# Patient Record
Sex: Male | Born: 1960
Health system: Southern US, Community
[De-identification: ages and names within clinical notes are randomized; demographics above are authoritative.]

## PROBLEM LIST (undated history)

## (undated) DIAGNOSIS — D440 Neoplasm of uncertain behavior of thyroid gland: Secondary | ICD-10-CM

## (undated) DIAGNOSIS — C61 Malignant neoplasm of prostate: Secondary | ICD-10-CM

## (undated) DIAGNOSIS — C73 Malignant neoplasm of thyroid gland: Secondary | ICD-10-CM

## (undated) DIAGNOSIS — E039 Hypothyroidism, unspecified: Secondary | ICD-10-CM

## (undated) DIAGNOSIS — I2510 Atherosclerotic heart disease of native coronary artery without angina pectoris: Secondary | ICD-10-CM

## (undated) HISTORY — DX: Atherosclerotic heart disease of native coronary artery without angina pectoris: I25.10

## (undated) HISTORY — DX: Malignant neoplasm of thyroid gland: C73

## (undated) HISTORY — PX: PROSTATECTOMY: SHX69

## (undated) HISTORY — PX: COLONOSCOPY: SHX174

## (undated) HISTORY — DX: Neoplasm of uncertain behavior of thyroid gland: D44.0

## (undated) HISTORY — DX: Malignant neoplasm of prostate: C61

---

## 2005-10-21 DIAGNOSIS — C61 Malignant neoplasm of prostate: Secondary | ICD-10-CM

## 2005-10-21 HISTORY — DX: Malignant neoplasm of prostate: C61

## 2012-05-12 ENCOUNTER — Ambulatory Visit (HOSPITAL_COMMUNITY)
Admission: RE | Admit: 2012-05-12 | Discharge: 2012-05-12 | Disposition: A | Discharge status: Home / Self Care | Payer: Managed Care, Other (non HMO) | Source: Ambulatory Visit | Attending: Gastroenterology | Admitting: Gastroenterology

## 2012-05-12 ENCOUNTER — Other Ambulatory Visit (HOSPITAL_COMMUNITY): Payer: Self-pay | Admitting: Gastroenterology

## 2012-05-12 DIAGNOSIS — R933 Abnormal findings on diagnostic imaging of other parts of digestive tract: Secondary | ICD-10-CM

## 2014-04-04 ENCOUNTER — Encounter: Payer: Self-pay | Admitting: Family Medicine

## 2014-04-12 ENCOUNTER — Encounter: Payer: Self-pay | Admitting: Family Medicine

## 2015-07-18 ENCOUNTER — Other Ambulatory Visit: Payer: Self-pay | Admitting: Obstetrics and Gynecology

## 2015-07-18 DIAGNOSIS — E041 Nontoxic single thyroid nodule: Secondary | ICD-10-CM

## 2015-08-03 ENCOUNTER — Ambulatory Visit
Admission: RE | Admit: 2015-08-03 | Discharge: 2015-08-03 | Disposition: A | Discharge status: Home / Self Care | Payer: BLUE CROSS/BLUE SHIELD | Source: Ambulatory Visit | Attending: Obstetrics and Gynecology | Admitting: Obstetrics and Gynecology

## 2015-08-03 DIAGNOSIS — E041 Nontoxic single thyroid nodule: Secondary | ICD-10-CM

## 2015-09-20 ENCOUNTER — Other Ambulatory Visit: Payer: Self-pay | Admitting: Surgery

## 2015-09-20 DIAGNOSIS — E041 Nontoxic single thyroid nodule: Secondary | ICD-10-CM

## 2015-09-27 ENCOUNTER — Ambulatory Visit
Admission: RE | Admit: 2015-09-27 | Discharge: 2015-09-27 | Disposition: A | Discharge status: Home / Self Care | Payer: BLUE CROSS/BLUE SHIELD | Source: Ambulatory Visit | Attending: Surgery | Admitting: Surgery

## 2015-09-27 ENCOUNTER — Other Ambulatory Visit (HOSPITAL_COMMUNITY)
Admission: RE | Admit: 2015-09-27 | Discharge: 2015-09-27 | Disposition: A | Discharge status: Home / Self Care | Payer: BLUE CROSS/BLUE SHIELD | Source: Ambulatory Visit | Attending: Radiology | Admitting: Radiology

## 2015-09-27 DIAGNOSIS — E042 Nontoxic multinodular goiter: Secondary | ICD-10-CM | POA: Diagnosis present

## 2015-09-27 DIAGNOSIS — E041 Nontoxic single thyroid nodule: Secondary | ICD-10-CM

## 2015-10-22 DIAGNOSIS — C73 Malignant neoplasm of thyroid gland: Secondary | ICD-10-CM

## 2015-10-22 HISTORY — DX: Malignant neoplasm of thyroid gland: C73

## 2015-11-01 ENCOUNTER — Ambulatory Visit: Payer: Self-pay | Admitting: Surgery

## 2015-11-13 NOTE — Patient Instructions (Signed)
Atzin Onken  11/13/2015   Your procedure is scheduled on: 11/16/2015   Report to [facility] Main  Entrance take [place]  elevators to 3rd floor to  [place] at   [place] AM.  Call this number if you have problems the morning of surgery [phone]   Remember: ONLY 1 PERSON MAY GO WITH YOU TO SHORT STAY TO GET  READY MORNING OF [place].  Do not eat food or drink liquids :After Midnight.     Take these medicines the morning of surgery with A SIP OF WATER: none                                 You may not have any metal on your body including hair pins and              piercings  Do not wear jewelry, , lotions, powders or perfumes, deodorant        .              Men may shave face and neck.   Do not bring valuables to the hospital. [place].  Contacts, dentures or bridgework may not be worn into surgery.  Leave suitcase in the car. After surgery it may be brought to your room.         Special Instructions: coughing and deep breathing exercises, leg exercises               Please read over the following fact sheets you were given: _____________________________________________________________________             [facility] - Preparing for Surgery Before surgery, you can play an important role.  Because skin is not sterile, your skin needs to be as free of germs as possible.  You can reduce the number of germs on your skin by washing with CHG (chlorahexidine gluconate) soap before surgery.  CHG is an antiseptic cleaner which kills germs and bonds with the skin to continue killing germs even after washing. Please DO NOT use if you have an allergy to CHG or antibacterial soaps.  If your skin becomes reddened/irritated stop using the CHG and inform your nurse when you arrive at Short Stay. Do not shave (including legs and underarms) for at least 48 hours prior to the first CHG shower.  You may shave  your face/neck. Please follow these instructions carefully:  1.  Shower with CHG Soap the night before surgery and the  morning of Surgery.  2.  If you choose to wash your hair, wash your hair first as usual with your  normal  shampoo.  3.  After you shampoo, rinse your hair and body thoroughly to remove the  shampoo.                           4.  Use CHG as you would any other liquid soap.  You can apply chg directly  to the skin and wash                       Gently with a scrungie or clean washcloth.  5.  Apply the CHG Soap to your  body ONLY FROM THE NECK DOWN.   Do not use on face/ open                           Wound or open sores. Avoid contact with eyes, ears mouth and genitals (private parts).                       Wash face,  Genitals (private parts) with your normal soap.             6.  Wash thoroughly, paying special attention to the area where your surgery  will be performed.  7.  Thoroughly rinse your body with warm water from the neck down.  8.  DO NOT shower/wash with your normal soap after using and rinsing off  the CHG Soap.                9.  Pat yourself dry with a clean towel.            10.  Wear clean pajamas.            11.  Place clean sheets on your bed the night of your first shower and do not  sleep with pets. Day of Surgery : Do not apply any lotions/deodorants the morning of surgery.  Please wear clean clothes to the hospital/surgery center.  FAILURE TO FOLLOW THESE INSTRUCTIONS MAY RESULT IN THE CANCELLATION OF YOUR SURGERY PATIENT SIGNATURE_________________________________  NURSE SIGNATURE__________________________________  ________________________________________________________________________

## 2015-11-13 NOTE — Anesthesia Preprocedure Evaluation (Addendum)
Anesthesia Evaluation  Patient identified by MRN, date of birth, ID band Patient awake    Reviewed: Allergy & Precautions, NPO status , Patient's Chart, lab work & pertinent test results  Airway Mallampati: II       Dental  (+) Chipped, Dental Advisory Given,    Pulmonary neg pulmonary ROS,    breath sounds clear to auscultation       Cardiovascular negative cardio ROS   Rhythm:Regular     Neuro/Psych negative neurological ROS  negative psych ROS   GI/Hepatic negative GI ROS, Neg liver ROS,   Endo/Other  negative endocrine ROS  Renal/GU negative Renal ROS   Hx Prostate CA    Musculoskeletal negative musculoskeletal ROS (+)   Abdominal (+)  Abdomen: soft.    Peds negative pediatric ROS (+)  Hematology negative hematology ROS (+)   Anesthesia Other Findings   Reproductive/Obstetrics negative OB ROS                            Anesthesia Physical Anesthesia Plan  ASA: II  Anesthesia Plan: General   Post-op Pain Management:    Induction: Intravenous  Airway Management Planned: Oral ETT  Additional Equipment:   Intra-op Plan:   Post-operative Plan: Extubation in OR  Informed Consent: I have reviewed the patients History and Physical, chart, labs and discussed the procedure including the risks, benefits and alternatives for the proposed anesthesia with the patient or authorized representative who has indicated his/her understanding and acceptance.     Plan Discussed with:   Anesthesia Plan Comments: (Check labs)        Anesthesia Quick Evaluation

## 2015-11-14 ENCOUNTER — Encounter (HOSPITAL_COMMUNITY)
Admission: RE | Admit: 2015-11-14 | Discharge: 2015-11-14 | Disposition: A | Discharge status: Home / Self Care | Payer: 59 | Source: Ambulatory Visit | Attending: Surgery | Admitting: Surgery

## 2015-11-14 ENCOUNTER — Ambulatory Visit (HOSPITAL_COMMUNITY)
Admission: RE | Admit: 2015-11-14 | Discharge: 2015-11-14 | Disposition: A | Discharge status: Home / Self Care | Payer: 59 | Source: Ambulatory Visit | Attending: Anesthesiology | Admitting: Anesthesiology

## 2015-11-14 ENCOUNTER — Encounter (HOSPITAL_COMMUNITY): Payer: Self-pay

## 2015-11-14 ENCOUNTER — Encounter (HOSPITAL_COMMUNITY): Payer: Self-pay | Admitting: Surgery

## 2015-11-14 DIAGNOSIS — D440 Neoplasm of uncertain behavior of thyroid gland: Secondary | ICD-10-CM

## 2015-11-14 DIAGNOSIS — Z01818 Encounter for other preprocedural examination: Secondary | ICD-10-CM | POA: Insufficient documentation

## 2015-11-14 HISTORY — DX: Neoplasm of uncertain behavior of thyroid gland: D44.0

## 2015-11-14 LAB — CBC
HCT: 39.8 % (ref 39.0–52.0)
Hemoglobin: 14.1 g/dL (ref 13.0–17.0)
MCH: 30.9 pg (ref 26.0–34.0)
MCHC: 35.4 g/dL (ref 30.0–36.0)
MCV: 87.3 fL (ref 78.0–100.0)
Platelets: 267 10*3/uL (ref 150–400)
RBC: 4.56 MIL/uL (ref 4.22–5.81)
RDW: 12.5 % (ref 11.5–15.5)
WBC: 4.1 10*3/uL (ref 4.0–10.5)

## 2015-11-14 NOTE — H&P (Signed)
  General Surgery [facility]  Todd Wood DOB: 1960-11-03 Married / Language: English / Race: White Male  History of Present Illness  Patient returns at my request to discuss his fine-needle aspiration thyroid biopsy results. Patient had bilateral thyroid nodules which required sampling. The dominant nodule on the right measured 2 cm and fine-needle aspiration biopsy showed benign thyroid nodule, Bethesda category II. On the left side, there was a 3.3 cm solid nodule with calcifications. Fine-needle aspiration biopsy showed suspicion of papillary thyroid carcinoma, Bethesda category V. Patient returns today to discuss these results and to make plans for definitive management.  Allergies No Known Drug Allergies11/29/2016  Medication History No Current Medications Medications Reconciled  Vitals Weight: 218 lb Height: 71in Body Surface Area: 2.19 m Body Mass Index: 30.4 kg/m  Pulse: 78 (Regular)  BP: 126/72 (Sitting, Left Arm, Standard)  Physical Exam  General - appears comfortable, no distress; not diaphorectic  HEENT - normocephalic; sclerae clear, gaze conjugate; mucous membranes moist, dentition good; voice normal  Neck - symmetric on extension; no palpable anterior or posterior cervical adenopathy; smooth firm palpable nodule right thyroid lobe approximately 2 cm in size, mobile with swallowing; smooth firm nodule approximately 3 cm in size inferior left thyroid lobe, palpable with swallowing  Ext - non-tender without significant edema or lymphedema  Neuro - grossly intact; no tremor   Assessment & Plan  PAPILLARY THYROID CARCINOMA (C73)  Patient returns to discuss biopsy results. We reviewed his cytopathology report as well as his original ultrasound report.  I have recommended proceeding with total thyroidectomy with limited lymph node dissection. We discussed procedure. We discussed risk and benefits including the potential for  recurrent laryngeal nerve injury and injury to parathyroid glands. We discussed the hospital stay to be anticipated in the postoperative recovery and return to work. We discussed the potential need for radioactive iodine treatment. Patient understands and wishes to proceed with surgery in the near future.  We will make arrangements for consultation with endocrinology following his surgical procedure. Recommendations for radioactive iodine treatment will be based on the final pathology results.  The risks and benefits of the procedure have been discussed at length with the patient. The patient understands the proposed procedure, potential alternative treatments, and the course of recovery to be expected. All of the patient's questions have been answered at this time. The patient wishes to proceed with surgery.  Earnstine Regal, MD, [facility] Office: [phone]

## 2015-11-16 ENCOUNTER — Ambulatory Visit (HOSPITAL_COMMUNITY): Payer: 59 | Admitting: Anesthesiology

## 2015-11-16 ENCOUNTER — Encounter (HOSPITAL_COMMUNITY): Payer: Self-pay | Admitting: *Deleted

## 2015-11-16 ENCOUNTER — Encounter (HOSPITAL_COMMUNITY)
Admission: RE | Disposition: A | Discharge status: Home / Self Care | Payer: Self-pay | Source: Ambulatory Visit | Attending: Surgery

## 2015-11-16 ENCOUNTER — Observation Stay (HOSPITAL_COMMUNITY)
Admission: RE | Admit: 2015-11-16 | Discharge: 2015-11-17 | Disposition: A | Discharge status: Home / Self Care | Payer: 59 | Source: Ambulatory Visit | Attending: Surgery | Admitting: Surgery

## 2015-11-16 DIAGNOSIS — C73 Malignant neoplasm of thyroid gland: Principal | ICD-10-CM | POA: Insufficient documentation

## 2015-11-16 DIAGNOSIS — D440 Neoplasm of uncertain behavior of thyroid gland: Secondary | ICD-10-CM | POA: Diagnosis present

## 2015-11-16 DIAGNOSIS — Z8546 Personal history of malignant neoplasm of prostate: Secondary | ICD-10-CM | POA: Diagnosis not present

## 2015-11-16 DIAGNOSIS — C770 Secondary and unspecified malignant neoplasm of lymph nodes of head, face and neck: Secondary | ICD-10-CM | POA: Insufficient documentation

## 2015-11-16 DIAGNOSIS — E042 Nontoxic multinodular goiter: Secondary | ICD-10-CM | POA: Diagnosis present

## 2015-11-16 HISTORY — PX: THYROIDECTOMY: SHX17

## 2015-11-16 LAB — BASIC METABOLIC PANEL
Anion gap: 9 (ref 5–15)
BUN: 16 mg/dL (ref 6–20)
CO2: 27 mmol/L (ref 22–32)
Calcium: 9 mg/dL (ref 8.9–10.3)
Chloride: 105 mmol/L (ref 101–111)
Creatinine, Ser: 0.89 mg/dL (ref 0.61–1.24)
GFR calc Af Amer: >60 mL/min (ref >60)
GFR calc non Af Amer: >60 mL/min (ref >60)
Glucose, Bld: 144 mg/dL — ABNORMAL HIGH (ref 65–99)
Potassium: 4.5 mmol/L (ref 3.5–5.1)
Sodium: 141 mmol/L (ref 135–145)

## 2015-11-16 SURGERY — THYROIDECTOMY
Anesthesia: General | Site: Neck

## 2015-11-16 MED ORDER — DEXAMETHASONE SODIUM PHOSPHATE 10 MG/ML IJ SOLN
INTRAMUSCULAR | Status: DC | PRN
  Administered 2015-11-16: 5 mg via INTRAVENOUS

## 2015-11-16 MED ORDER — LIDOCAINE HCL (CARDIAC) 20 MG/ML IV SOLN
INTRAVENOUS | Status: AC
  Filled 2015-11-16: qty 5

## 2015-11-16 MED ORDER — FENTANYL CITRATE (PF) 100 MCG/2ML IJ SOLN
INTRAMUSCULAR | Status: AC
  Filled 2015-11-16: qty 2

## 2015-11-16 MED ORDER — HYDROMORPHONE HCL 1 MG/ML IJ SOLN
0.2500 mg | INTRAMUSCULAR | Status: DC | PRN
  Administered 2015-11-16 (×2): 0.5 mg via INTRAVENOUS

## 2015-11-16 MED ORDER — LIDOCAINE HCL (CARDIAC) 20 MG/ML IV SOLN
INTRAVENOUS | Status: DC | PRN
  Administered 2015-11-16: 100 mg via INTRAVENOUS

## 2015-11-16 MED ORDER — FENTANYL CITRATE (PF) 100 MCG/2ML IJ SOLN
INTRAMUSCULAR | Status: DC | PRN
  Administered 2015-11-16: 50 ug via INTRAVENOUS
  Administered 2015-11-16 (×4): 25 ug via INTRAVENOUS
  Administered 2015-11-16: 50 ug via INTRAVENOUS

## 2015-11-16 MED ORDER — CALCIUM CARBONATE 1250 (500 CA) MG PO TABS
2.0000 | ORAL_TABLET | Freq: Three times a day (TID) | ORAL | Status: DC
Start: 2015-11-16 — End: 2015-11-17
  Administered 2015-11-17: 1000 mg via ORAL
  Filled 2015-11-16 (×5): qty 2

## 2015-11-16 MED ORDER — ACETAMINOPHEN 650 MG RE SUPP: 650.0000 mg | Freq: Four times a day (QID) | RECTAL | Status: DC | PRN

## 2015-11-16 MED ORDER — CEFAZOLIN SODIUM-DEXTROSE 2-3 GM-% IV SOLR
2.0000 g | INTRAVENOUS | Status: AC
  Administered 2015-11-16: 2 g via INTRAVENOUS

## 2015-11-16 MED ORDER — HYDROMORPHONE HCL 1 MG/ML IJ SOLN
INTRAMUSCULAR | Status: AC
  Filled 2015-11-16: qty 1

## 2015-11-16 MED ORDER — HYDROCODONE-ACETAMINOPHEN 5-325 MG PO TABS: 1.0000 | ORAL_TABLET | ORAL | Status: DC | PRN

## 2015-11-16 MED ORDER — ONDANSETRON HCL 4 MG/2ML IJ SOLN
INTRAMUSCULAR | Status: DC | PRN
  Administered 2015-11-16 (×2): 4 mg via INTRAVENOUS

## 2015-11-16 MED ORDER — SODIUM CHLORIDE 0.9 % IJ SOLN
INTRAMUSCULAR | Status: AC
  Filled 2015-11-16: qty 10

## 2015-11-16 MED ORDER — EPHEDRINE SULFATE 50 MG/ML IJ SOLN
INTRAMUSCULAR | Status: AC
  Filled 2015-11-16: qty 1

## 2015-11-16 MED ORDER — DEXAMETHASONE SODIUM PHOSPHATE 10 MG/ML IJ SOLN
INTRAMUSCULAR | Status: AC
  Filled 2015-11-16: qty 1

## 2015-11-16 MED ORDER — MIDAZOLAM HCL 5 MG/5ML IJ SOLN
INTRAMUSCULAR | Status: DC | PRN
Start: 2015-11-16 — End: 2015-11-16
  Administered 2015-11-16: 2 mg via INTRAVENOUS

## 2015-11-16 MED ORDER — ONDANSETRON 4 MG PO TBDP: 4.0000 mg | ORAL_TABLET | Freq: Four times a day (QID) | ORAL | Status: DC | PRN

## 2015-11-16 MED ORDER — 0.9 % SODIUM CHLORIDE (POUR BTL) OPTIME
TOPICAL | Status: DC | PRN
  Administered 2015-11-16: 1000 mL

## 2015-11-16 MED ORDER — ONDANSETRON HCL 4 MG/2ML IJ SOLN
INTRAMUSCULAR | Status: AC
  Filled 2015-11-16: qty 4

## 2015-11-16 MED ORDER — SUGAMMADEX SODIUM 200 MG/2ML IV SOLN
INTRAVENOUS | Status: AC
  Filled 2015-11-16: qty 2

## 2015-11-16 MED ORDER — PROPOFOL 10 MG/ML IV BOLUS
INTRAVENOUS | Status: DC | PRN
  Administered 2015-11-16: 200 mg via INTRAVENOUS

## 2015-11-16 MED ORDER — CEFAZOLIN SODIUM-DEXTROSE 2-3 GM-% IV SOLR
INTRAVENOUS | Status: AC
  Filled 2015-11-16: qty 50

## 2015-11-16 MED ORDER — HYDRALAZINE HCL 20 MG/ML IJ SOLN
10.0000 mg | Freq: Once | INTRAMUSCULAR | Status: AC | PRN
  Administered 2015-11-16: 10 mg via INTRAVENOUS

## 2015-11-16 MED ORDER — SUGAMMADEX SODIUM 200 MG/2ML IV SOLN
INTRAVENOUS | Status: DC | PRN
  Administered 2015-11-16: 200 mg via INTRAVENOUS

## 2015-11-16 MED ORDER — PROMETHAZINE HCL 25 MG/ML IJ SOLN: 6.2500 mg | INTRAMUSCULAR | Status: DC | PRN

## 2015-11-16 MED ORDER — PROPOFOL 10 MG/ML IV BOLUS
INTRAVENOUS | Status: AC
  Filled 2015-11-16: qty 20

## 2015-11-16 MED ORDER — FENTANYL CITRATE (PF) 100 MCG/2ML IJ SOLN
25.0000 ug | INTRAMUSCULAR | Status: DC | PRN
  Administered 2015-11-16 (×2): 50 ug via INTRAVENOUS

## 2015-11-16 MED ORDER — KCL IN DEXTROSE-NACL 20-5-0.45 MEQ/L-%-% IV SOLN
INTRAVENOUS | Status: DC
  Administered 2015-11-16: 20:00:00 via INTRAVENOUS
  Filled 2015-11-16 (×2): qty 1000

## 2015-11-16 MED ORDER — GLYCOPYRROLATE 0.2 MG/ML IJ SOLN
INTRAMUSCULAR | Status: AC
  Filled 2015-11-16: qty 1

## 2015-11-16 MED ORDER — GLYCOPYRROLATE 0.2 MG/ML IJ SOLN
INTRAMUSCULAR | Status: DC | PRN
  Administered 2015-11-16: 0.2 mg via INTRAVENOUS

## 2015-11-16 MED ORDER — ROCURONIUM BROMIDE 100 MG/10ML IV SOLN
INTRAVENOUS | Status: DC | PRN
  Administered 2015-11-16 (×4): 10 mg via INTRAVENOUS
  Administered 2015-11-16: 50 mg via INTRAVENOUS
  Administered 2015-11-16: 10 mg via INTRAVENOUS

## 2015-11-16 MED ORDER — HYDROMORPHONE HCL 1 MG/ML IJ SOLN: 1.0000 mg | INTRAMUSCULAR | Status: DC | PRN

## 2015-11-16 MED ORDER — ACETAMINOPHEN 325 MG PO TABS
650.0000 mg | ORAL_TABLET | Freq: Four times a day (QID) | ORAL | Status: DC | PRN
Start: 2015-11-16 — End: 2015-11-17

## 2015-11-16 MED ORDER — ONDANSETRON HCL 4 MG/2ML IJ SOLN
4.0000 mg | Freq: Four times a day (QID) | INTRAMUSCULAR | Status: DC | PRN
  Administered 2015-11-16: 4 mg via INTRAVENOUS
  Filled 2015-11-16: qty 2

## 2015-11-16 MED ORDER — MEPERIDINE HCL 50 MG/ML IJ SOLN: 6.2500 mg | INTRAMUSCULAR | Status: DC | PRN

## 2015-11-16 MED ORDER — EPHEDRINE SULFATE 50 MG/ML IJ SOLN
INTRAMUSCULAR | Status: DC | PRN
  Administered 2015-11-16: 5 mg via INTRAVENOUS

## 2015-11-16 MED ORDER — HYDRALAZINE HCL 20 MG/ML IJ SOLN
INTRAMUSCULAR | Status: AC
  Filled 2015-11-16: qty 1

## 2015-11-16 MED ORDER — MIDAZOLAM HCL 2 MG/2ML IJ SOLN
INTRAMUSCULAR | Status: AC
  Filled 2015-11-16: qty 2

## 2015-11-16 MED ORDER — LACTATED RINGERS IV SOLN
INTRAVENOUS | Status: DC | PRN
  Administered 2015-11-16 (×2): via INTRAVENOUS

## 2015-11-16 SURGICAL SUPPLY — 37 items
ATTRACTOMAT 16X20 MAGNETIC DRP (DRAPES) ×2 IMPLANT
BENZOIN TINCTURE PRP APPL 2/3 (GAUZE/BANDAGES/DRESSINGS) ×2 IMPLANT
BLADE HEX COATED 2.75 (ELECTRODE) ×2 IMPLANT
BLADE SURG 15 STRL LF DISP TIS (BLADE) ×1 IMPLANT
BLADE SURG 15 STRL SS (BLADE) ×1
CHLORAPREP W/TINT 26ML (MISCELLANEOUS) ×2 IMPLANT
CLIP TI MEDIUM 6 (CLIP) ×12 IMPLANT
CLIP TI WIDE RED SMALL 6 (CLIP) ×10 IMPLANT
CLOSURE STERI-STRIP 1/4X4 (GAUZE/BANDAGES/DRESSINGS) ×2 IMPLANT
COVER SURGICAL LIGHT HANDLE (MISCELLANEOUS) ×2 IMPLANT
DRAPE LAPAROTOMY T 98X78 PEDS (DRAPES) ×2 IMPLANT
DRESSING SURGICEL FIBRLLR 1X2 (HEMOSTASIS) ×1 IMPLANT
DRSG SURGICEL FIBRILLAR 1X2 (HEMOSTASIS) ×2
ELECT PENCIL ROCKER SW 15FT (MISCELLANEOUS) ×2 IMPLANT
ELECT REM PT RETURN 9FT ADLT (ELECTROSURGICAL) ×2
ELECTRODE REM PT RTRN 9FT ADLT (ELECTROSURGICAL) ×1 IMPLANT
GAUZE SPONGE 4X4 16PLY XRAY LF (GAUZE/BANDAGES/DRESSINGS) ×4 IMPLANT
GLOVE BIO SURGEON STRL SZ 6.5 (GLOVE) ×4 IMPLANT
GLOVE INDICATOR 6.5 STRL GRN (GLOVE) ×4 IMPLANT
GLOVE SURG ORTHO 8.0 STRL STRW (GLOVE) ×2 IMPLANT
GOWN STRL REUS W/TWL LRG LVL3 (GOWN DISPOSABLE) ×2 IMPLANT
GOWN STRL REUS W/TWL XL LVL3 (GOWN DISPOSABLE) ×6 IMPLANT
KIT BASIN OR (CUSTOM PROCEDURE TRAY) ×2 IMPLANT
PACK BASIC VI WITH GOWN DISP (CUSTOM PROCEDURE TRAY) ×2 IMPLANT
SHEARS HARMONIC 9CM CVD (BLADE) ×2 IMPLANT
STRIP CLOSURE SKIN 1/2X4 (GAUZE/BANDAGES/DRESSINGS) ×2 IMPLANT
SUT MNCRL AB 4-0 PS2 18 (SUTURE) ×2 IMPLANT
SUT SILK 2 0 (SUTURE)
SUT SILK 2-0 18XBRD TIE 12 (SUTURE) IMPLANT
SUT SILK 3 0 (SUTURE)
SUT SILK 3-0 18XBRD TIE 12 (SUTURE) IMPLANT
SUT VIC AB 3-0 SH 18 (SUTURE) ×4 IMPLANT
SYR BULB IRRIGATION 50ML (SYRINGE) ×2 IMPLANT
TAPE CLOTH SURG 4X10 WHT LF (GAUZE/BANDAGES/DRESSINGS) ×2 IMPLANT
TOWEL OR 17X26 10 PK STRL BLUE (TOWEL DISPOSABLE) ×2 IMPLANT
TOWEL OR NON WOVEN STRL DISP B (DISPOSABLE) ×2 IMPLANT
YANKAUER SUCT BULB TIP 10FT TU (MISCELLANEOUS) ×2 IMPLANT

## 2015-11-16 NOTE — Anesthesia Procedure Notes (Signed)
Procedure Name: Intubation Date/Time: 11/16/2015 8:08 AM Performed by: Freddie Breech Pre-anesthesia Checklist: Patient identified, Emergency Drugs available, Suction available, Patient being monitored and Timeout performed Patient Re-evaluated:Patient Re-evaluated prior to inductionOxygen Delivery Method: Circle system utilized Preoxygenation: Pre-oxygenation with 100% oxygen Intubation Type: IV induction Ventilation: Oral airway inserted - appropriate to patient size and Mask ventilation without difficulty Laryngoscope Size: Mac and 4 Grade View: Grade I Tube type: Oral Tube size: 7.0 mm Number of attempts: 1 Airway Equipment and Method: Patient positioned with wedge pillow and Stylet Placement Confirmation: ETT inserted through vocal cords under direct vision,  positive ETCO2,  CO2 detector and breath sounds checked- equal and bilateral Secured at: 21 cm Tube secured with: Tape Dental Injury: Teeth and Oropharynx as per pre-operative assessment

## 2015-11-16 NOTE — Anesthesia Postprocedure Evaluation (Signed)
Anesthesia Post Note  Patient: Todd Wood  Procedure(s) Performed: Procedure(s) (LRB): TOTAL THYROIDECTOMY WITH CENTRAL COMPARTMENT LYMPH NODE DISSECTION (N/A)  Patient location during evaluation: PACU Anesthesia Type: General Level of consciousness: awake and alert Pain management: pain level controlled Vital Signs Assessment: post-procedure vital signs reviewed and stable Respiratory status: spontaneous breathing, nonlabored ventilation, respiratory function stable and patient connected to nasal cannula oxygen Cardiovascular status: blood pressure returned to baseline and stable Postop Assessment: no signs of nausea or vomiting Anesthetic complications: no    Last Vitals:  Filed Vitals:   11/16/15 0549 11/16/15 1025  BP: 120/86 166/93  Pulse: 66 66  Temp: 36.6 C 36.5 C  Resp: 18 15    Last Pain:  Filed Vitals:   11/16/15 1032  PainSc: 4                  Todd Wood

## 2015-11-16 NOTE — Interval H&P Note (Signed)
History and Physical Interval Note:  11/16/2015 7:23 AM  Todd Wood  has presented today for surgery, with the diagnosis of Papillary thyroid carcinoma.  The various methods of treatment have been discussed with the patient and family. After consideration of risks, benefits and other options for treatment, the patient has consented to    Procedure(s): TOTAL THYROIDECTOMY LIMITED LYMPH NODE DISSECTION (N/A) as a surgical intervention .    The patient's history has been reviewed, patient examined, no change in status, stable for surgery.  I have reviewed the patient's chart and labs.  Questions were answered to the patient's satisfaction.    Earnstine Regal, MD, [facility] Office: [place]

## 2015-11-16 NOTE — Op Note (Signed)
NAMESANAT, [place] NO.:  [id number]  MEDICAL RECORD NO.:  [id number]  LOCATION:  [facility]                         FACILITY:  [facility]  PHYSICIAN:  Earnstine Regal, MD      DATE OF BIRTH:  [birth year]  DATE OF PROCEDURE:  11/16/2015                              OPERATIVE REPORT   PREOPERATIVE DIAGNOSIS:  Probable papillary thyroid carcinoma.  POSTOPERATIVE DIAGNOSIS:  Probable papillary thyroid carcinoma with regional lymph node metastasis, local invasion.  PROCEDURE:  Total thyroidectomy with limited central compartment lymph node dissection.  SURGEON:  Earnstine Regal, MD, FACS  ANESTHESIA:  General per Dr. Alexis Frock  ESTIMATED BLOOD LOSS:  50 mL.  PREPARATION:  ChloraPrep.  COMPLICATIONS:  None.  INDICATIONS:  The patient is a 55 year old white male referred with a thyroid mass.  Ultrasound-guided fine needle aspiration biopsy of a dominant 3.3 cm solid lesion in the left thyroid lobe with calcifications showed suspicion of papillary thyroid carcinoma Bethesda category V.  The patient now comes to surgery for resection.  BODY OF REPORT:  Procedure was done in OR #2 at the [facility].  The patient was brought to the operating room, placed in the supine position on the operating room table.  Following administration of general anesthesia, the patient was positioned and then prepped and draped in the usual aseptic fashion.  After ascertaining that an adequate level of anesthesia had been achieved, a Kocher incision was made with a #15 blade.  Dissection was carried through subcutaneous tissues and platysma.  Hemostasis was achieved with electrocautery.  Skin flaps were elevated cephalad and caudad from the thyroid notch to the sternal notch.  The Mahorner self-retaining retractor was placed for exposure.  Strap muscles were incised in the midline.  Dissection was begun on the left.  With some difficulty, the strap muscles were  mobilized off the underlying thyroid gland and mobilized laterally.  The thyroid is grossly abnormal, quite hard, multilobular, and somewhat adherent to the overlying strap muscles worrisome for local invasion.  The strap muscles were mobilized and the gland was gently dissected out.  Venous tributaries were divided between Ligaclips with the Harmonic scalpel.  Superior pole vessels were dissected out and divided individually between small and medium Ligaclips with the Harmonic scalpel.  Inferior pole was dissected out. There are obvious lymph node metastasis in the central compartment just inferior to the inferior pole of the thyroid.  These will be dissected out separately.  The gland is mobilized and rolled anteriorly.  Branches of the inferior thyroid artery were divided between small and medium Ligaclips with the Harmonic scalpel.  There was tumor extension posteriorly into the region of the esophagus and recurrent laryngeal nerve.  Care was taken to preserve structures, but it was difficult to identify either the nerve or the parathyroid glands.  Dissection was continued on the capsule of the gland and tumor.  Tumor appears to invade locally into the underlying airway.  It was gently mobilized using the electrocautery where necessary to separate the tumor from the underlying structures.  Ligament of Gwenlyn Found is released.  There appears that tumor extends into the tubercle of Zuckerkandl.  The gland was then resected off the trachea using the electrocautery.  There was no significant pyramidal lobe present.  Dry gauze dressing was placed in the left neck.  Next, we turned our attention to the right thyroid lobe.  The right thyroid lobe was somewhat softer, but indeed multinodular and appears to contain some cystic neoplasms.  It was gently mobilized with blunt dissection and venous tributaries divided between Ligaclips with the Harmonic scalpel.  Superior pole vessels were dissected  out and divided between medium Ligaclips with the Harmonic scalpel.  Gland was rolled anteriorly.  Branches of the inferior thyroid artery were divided between small Ligaclips.  Inferior pole was mobilized.  There appears to be tumor bearing lymph nodes along the inferior pole.  There appears to be tumor extending into the right thyroid lobe at its mid portion. Gland was rolled anteriorly and the ligament of Gwenlyn Found was released and the gland was mobilized onto the anterior trachea from which it was completely excised using the electrocautery.  Suture was used to mark the left superior pole.  The entire thyroid gland was submitted to Pathology for review.  Inferior to the thyroid, there are multiple palpable firm lymph nodes which likely contained tumor.  A central compartment lymph node dissection was performed beginning at the right carotid artery and sweeping tissue towards the left.  Dissection carried into the anterior mediastinum down to the innominate artery.  All palpable lymph nodes were resected with the specimen.  Dissection was completed at the left carotid artery and remaining tissue excised and submitted labeled as central compartment lymph nodes.  Good hemostasis was achieved throughout the neck.  The neck was irrigated with warm saline.  This was evacuated.  Fibrillar was placed throughout the operative field.  Strap muscles were reapproximated in the midline with interrupted 3-0 Vicryl sutures.  Platysma was closed with interrupted 3-0 Vicryl sutures.  Skin was closed with a running 4-0 Monocryl subcuticular suture.  Wound was washed and dried and benzoin and Steri-Strips were applied.  Sterile dressings were applied.  The patient was awakened from anesthesia and brought to the recovery room. The patient tolerated the procedure well.  Earnstine Regal, MD, [facility] Office: [phone]   TMG/MEDQ  D:  11/16/2015   T:  11/16/2015  Job:  [id number]  cc:   Delmar Landau) Harrington Challenger, M.D. Fax: [phone]  Katina Degree, M.D. Fax: [phone]

## 2015-11-16 NOTE — Transfer of Care (Signed)
Immediate Anesthesia Transfer of Care Note  Patient: Todd Wood  Procedure(s) Performed: Procedure(s): TOTAL THYROIDECTOMY WITH CENTRAL COMPARTMENT LYMPH NODE DISSECTION (N/A)  Patient Location: PACU  Anesthesia Type:General  Level of Consciousness:  sedated, patient cooperative and responds to stimulation  Airway & Oxygen Therapy:Patient Spontanous Breathing and Patient connected to face mask oxgen  Post-op Assessment:  Report given to PACU RN and Post -op Vital signs reviewed and stable  Post vital signs:  Reviewed and stable  Last Vitals:  Filed Vitals:   11/16/15 0549 11/16/15 1025  BP: 120/86 166/93  Pulse: 66 66  Temp: 36.6 C   Resp: 18 15    Complications: No apparent anesthesia complications

## 2015-11-16 NOTE — Brief Op Note (Signed)
11/16/2015  10:25 AM  PATIENT:  Todd Wood  55 y.o. male  PRE-OPERATIVE DIAGNOSIS:  Papillary thyroid carcinoma  POST-OPERATIVE DIAGNOSIS:  Papillary thyroid carcinoma with lymph node metastasis and local invasion  PROCEDURE:  Procedure(s): TOTAL THYROIDECTOMY WITH CENTRAL COMPARTMENT LYMPH NODE DISSECTION (N/A)  SURGEON:  Surgeon(s) and Role:    * Armandina Gemma, MD - Primary  ANESTHESIA:   general  EBL:  Total I/O In: 1500 [I.V.:1500] Out: -   BLOOD ADMINISTERED:none  DRAINS: none   LOCAL MEDICATIONS USED:  NONE  SPECIMEN:  Excision  DISPOSITION OF SPECIMEN:  PATHOLOGY  COUNTS:  YES  TOURNIQUET:  * No tourniquets in log *  DICTATION: .Other Dictation: Dictation Number [id number]  PLAN OF CARE: Admit for overnight observation  PATIENT DISPOSITION:  PACU - hemodynamically stable.   Delay start of Pharmacological VTE agent (>24hrs) due to surgical blood loss or risk of bleeding: yes  Earnstine Regal, MD, [facility] Office: [phone]

## 2015-11-17 DIAGNOSIS — C73 Malignant neoplasm of thyroid gland: Secondary | ICD-10-CM | POA: Diagnosis not present

## 2015-11-17 LAB — BASIC METABOLIC PANEL
Anion gap: 9 (ref 5–15)
BUN: 12 mg/dL (ref 6–20)
CO2: 26 mmol/L (ref 22–32)
Calcium: 8.7 mg/dL — ABNORMAL LOW (ref 8.9–10.3)
Chloride: 108 mmol/L (ref 101–111)
Creatinine, Ser: 0.69 mg/dL (ref 0.61–1.24)
GFR calc Af Amer: >60 mL/min (ref >60)
GFR calc non Af Amer: >60 mL/min (ref >60)
Glucose, Bld: 111 mg/dL — ABNORMAL HIGH (ref 65–99)
Potassium: 3.8 mmol/L (ref 3.5–5.1)
Sodium: 143 mmol/L (ref 135–145)

## 2015-11-17 MED ORDER — HYDROCODONE-ACETAMINOPHEN 5-325 MG PO TABS: 1.0000 | ORAL_TABLET | ORAL | Status: DC | PRN

## 2015-11-17 MED ORDER — SYNTHROID 125 MCG PO TABS: 125.0000 ug | ORAL_TABLET | Freq: Every day | ORAL | Status: DC

## 2015-11-17 MED ORDER — CALCIUM CARBONATE 1250 (500 CA) MG PO TABS: 2.0000 | ORAL_TABLET | Freq: Two times a day (BID) | ORAL | Status: DC

## 2015-11-17 NOTE — Discharge Summary (Signed)
Physician Discharge Summary [facility]  Patient ID: Todd Wood MRN: [id number] DOB/AGE: 56-06-1961 56 y.o.  Admit date: 11/16/2015 Discharge date: 11/17/2015  Admission Diagnoses:  Suspected papillary thyroid carcinoma  Discharge Diagnoses:  Principal Problem:   Neoplasm of uncertain behavior of thyroid gland Active Problems:   Papillary thyroid carcinoma [facility])   Discharged Condition: good  Hospital Course: Patient was admitted for observation following thyroid surgery.  Post op course was uncomplicated.  Pain was well controlled.  Tolerated diet.  Post op calcium level on morning following surgery was 8.7 mg/dl.  Patient was prepared for discharge home on POD#1.  Consults: None  Treatments: surgery: total thyroidectomy with limited lymph node dissection  Discharge Exam: Blood pressure 129/80, pulse 65, temperature 97.7 F (36.5 C), temperature source Oral, resp. rate 18, height 5' 11.5" (1.816 m), weight 97.523 kg (215 lb), SpO2 97 %. HEENT - clear Neck - wound dry and intact; mild STS; voice normal Chest - clear bilaterally Cor - RRR  Disposition: Home  Discharge Instructions    Apply dressing    Complete by:  As directed   Apply light gauze dressing to wound before discharge home today.     Diet - low sodium heart healthy    Complete by:  As directed      Discharge instructions    Complete by:  As directed   [facility]  THYROID & PARATHYROID SURGERY:  POST-OP INSTRUCTIONS  Always review your discharge instruction sheet from the facility where your surgery was performed.  A prescription for pain medication may be given to you upon discharge.  Take your pain medication as prescribed.  If narcotic pain medicine is not needed, then you may take acetaminophen (Tylenol) or ibuprofen (Advil) as needed.  Take your usually prescribed medications unless otherwise directed.  If you need a refill on your pain medication, please  contact your pharmacy. They will contact our office to request authorization.  Prescriptions will not be processed by our office after 5 pm or on weekends.  Start with a light diet upon arrival home, such as soup and crackers or toast.  Be sure to drink plenty of fluids daily.  Resume your normal diet the day after surgery.  Most patients will experience some swelling and bruising on the chest and neck area.  Ice packs will help.  Swelling and bruising can take several days to resolve.   It is common to experience some constipation after surgery.  Increasing fluid intake and taking a stool softener will usually help or prevent this problem.  A mild laxative (Milk of Magnesia or Miralax) should be taken according to package directions if there has been no bowel movement after 48 hours.  You have steri-strips and a gauze dressing over your incision.  You may remove the gauze bandage on the second day after surgery, and you may shower at that time.  Leave your steri-strips (small skin tapes) in place directly over the incision.  These strips should remain on the skin for 5-7 days and then be removed.  You may get them wet in the shower and pat them dry.  You may resume regular (light) daily activities beginning the next day - such as daily self-care, walking, climbing stairs - gradually increasing activities as tolerated.  You may have sexual intercourse when it is comfortable.  Refrain from any heavy lifting or straining until approved by your doctor.  You may drive when you no longer are taking  prescription pain medication, you can comfortably wear a seatbelt, and you can safely maneuver your car and apply brakes.  You should see your doctor in the office for a follow-up appointment approximately two to three weeks after your surgery.  Make sure that you call for this appointment within a day or two after you arrive home to insure a convenient appointment time.  WHEN TO CALL YOUR DOCTOR: -- Fever  greater than 101.5 -- Inability to urinate -- Nausea and/or vomiting - persistent -- Extreme swelling or bruising -- Continued bleeding from incision -- Increased pain, redness, or drainage from the incision -- Difficulty swallowing or breathing -- Muscle cramping or spasms -- Numbness or tingling in hands or around lips  The clinic staff is available to answer your questions during regular business hours.  Please don't hesitate to call and ask to speak to one of the nurses if you have concerns.  Earnstine Regal, MD, [facility] Office: [phone]  Website: [email or web address]     Ice pack    Complete by:  As directed      Increase activity slowly    Complete by:  As directed      Remove dressing in 24 hours    Complete by:  As directed             Medication List    TAKE these medications        calcium carbonate 1250 (500 Ca) MG tablet  Commonly known as:  OS-CAL - dosed in mg of elemental calcium  Take 2 tablets (1,000 mg of elemental calcium total) by mouth 2 (two) times daily with a meal.     HYDROcodone-acetaminophen 5-325 MG tablet  Commonly known as:  NORCO/VICODIN  Take 1-2 tablets by mouth every 4 (four) hours as needed for moderate pain.     SYNTHROID 125 MCG tablet  Generic drug:  levothyroxine  Take 1 tablet (125 mcg total) by mouth daily before breakfast.         Earnstine Regal, MD, [facility] Office: [phone]   Signed: Earnstine Regal 11/17/2015, 7:55 AM

## 2015-11-21 NOTE — Progress Notes (Signed)
Quick Note:  Results called to patient and discussed. Will forward to primary physician and endocrinologist.  Earnstine Regal, MD, [facility] Office: [phone]    ______

## 2015-11-24 ENCOUNTER — Other Ambulatory Visit: Payer: Self-pay | Admitting: Internal Medicine

## 2015-11-24 DIAGNOSIS — C73 Malignant neoplasm of thyroid gland: Secondary | ICD-10-CM

## 2015-12-19 ENCOUNTER — Ambulatory Visit
Admission: RE | Admit: 2015-12-19 | Discharge: 2015-12-19 | Disposition: A | Discharge status: Home / Self Care | Payer: 59 | Source: Ambulatory Visit | Attending: Internal Medicine | Admitting: Internal Medicine

## 2015-12-19 DIAGNOSIS — C73 Malignant neoplasm of thyroid gland: Secondary | ICD-10-CM

## 2015-12-28 ENCOUNTER — Other Ambulatory Visit (HOSPITAL_COMMUNITY): Payer: Self-pay | Admitting: Internal Medicine

## 2015-12-28 DIAGNOSIS — C73 Malignant neoplasm of thyroid gland: Secondary | ICD-10-CM

## 2016-01-25 ENCOUNTER — Encounter (HOSPITAL_COMMUNITY)
Admission: RE | Admit: 2016-01-25 | Discharge: 2016-01-25 | Disposition: A | Discharge status: Home / Self Care | Payer: 59 | Source: Ambulatory Visit | Attending: Internal Medicine | Admitting: Internal Medicine

## 2016-01-25 DIAGNOSIS — C73 Malignant neoplasm of thyroid gland: Secondary | ICD-10-CM | POA: Diagnosis present

## 2016-01-25 MED ORDER — SODIUM IODIDE I 131 CAPSULE
120.3000 | Freq: Once | INTRAVENOUS | Status: AC | PRN
  Administered 2016-01-25: 120.3 via ORAL

## 2016-02-02 ENCOUNTER — Encounter (HOSPITAL_COMMUNITY)
Admission: RE | Admit: 2016-02-02 | Discharge: 2016-02-02 | Disposition: A | Discharge status: Home / Self Care | Payer: 59 | Source: Ambulatory Visit | Attending: Internal Medicine | Admitting: Internal Medicine

## 2016-02-02 DIAGNOSIS — C73 Malignant neoplasm of thyroid gland: Secondary | ICD-10-CM

## 2016-05-30 ENCOUNTER — Other Ambulatory Visit: Payer: Self-pay | Admitting: Internal Medicine

## 2016-05-30 DIAGNOSIS — C73 Malignant neoplasm of thyroid gland: Secondary | ICD-10-CM

## 2016-05-30 DIAGNOSIS — E890 Postprocedural hypothyroidism: Secondary | ICD-10-CM

## 2016-06-07 ENCOUNTER — Ambulatory Visit
Admission: RE | Admit: 2016-06-07 | Discharge: 2016-06-07 | Disposition: A | Discharge status: Home / Self Care | Payer: 59 | Source: Ambulatory Visit | Attending: Internal Medicine | Admitting: Internal Medicine

## 2016-06-07 DIAGNOSIS — C73 Malignant neoplasm of thyroid gland: Secondary | ICD-10-CM

## 2016-06-07 DIAGNOSIS — E890 Postprocedural hypothyroidism: Secondary | ICD-10-CM

## 2016-06-27 ENCOUNTER — Other Ambulatory Visit: Payer: Self-pay | Admitting: Internal Medicine

## 2016-06-27 DIAGNOSIS — E041 Nontoxic single thyroid nodule: Secondary | ICD-10-CM

## 2016-07-04 ENCOUNTER — Other Ambulatory Visit (HOSPITAL_COMMUNITY)
Admission: RE | Admit: 2016-07-04 | Discharge: 2016-07-04 | Disposition: A | Discharge status: Home / Self Care | Payer: 59 | Source: Ambulatory Visit | Attending: Interventional Radiology | Admitting: Interventional Radiology

## 2016-07-04 ENCOUNTER — Ambulatory Visit
Admission: RE | Admit: 2016-07-04 | Discharge: 2016-07-04 | Disposition: A | Discharge status: Home / Self Care | Payer: 59 | Source: Ambulatory Visit | Attending: Internal Medicine | Admitting: Internal Medicine

## 2016-07-04 DIAGNOSIS — E041 Nontoxic single thyroid nodule: Secondary | ICD-10-CM

## 2016-07-04 DIAGNOSIS — R599 Enlarged lymph nodes, unspecified: Secondary | ICD-10-CM | POA: Insufficient documentation

## 2016-07-04 NOTE — Procedures (Signed)
Interventional Radiology Procedure Note  Procedure:US guided FNA of left cervical lymph node, suspicious.  Lateral to thyroidectomy bed.  Complications: None Recommendations:  - Ok to shower tomorrow - Routine care   Signed,  Dulcy Fanny. Earleen Newport, DO

## 2016-07-10 ENCOUNTER — Other Ambulatory Visit: Payer: Self-pay | Admitting: Internal Medicine

## 2016-07-10 DIAGNOSIS — C73 Malignant neoplasm of thyroid gland: Secondary | ICD-10-CM

## 2016-07-10 DIAGNOSIS — C770 Secondary and unspecified malignant neoplasm of lymph nodes of head, face and neck: Secondary | ICD-10-CM

## 2016-07-22 ENCOUNTER — Ambulatory Visit
Admission: RE | Admit: 2016-07-22 | Discharge: 2016-07-22 | Disposition: A | Discharge status: Home / Self Care | Payer: 59 | Source: Ambulatory Visit | Attending: Internal Medicine | Admitting: Internal Medicine

## 2016-07-22 ENCOUNTER — Other Ambulatory Visit: Payer: Self-pay | Admitting: Internal Medicine

## 2016-07-22 DIAGNOSIS — C73 Malignant neoplasm of thyroid gland: Secondary | ICD-10-CM

## 2016-07-22 DIAGNOSIS — C770 Secondary and unspecified malignant neoplasm of lymph nodes of head, face and neck: Secondary | ICD-10-CM

## 2016-07-22 MED ORDER — IOPAMIDOL (ISOVUE-300) INJECTION 61%
75.0000 mL | Freq: Once | INTRAVENOUS | Status: AC | PRN
  Administered 2016-07-22: 75 mL via INTRAVENOUS

## 2016-09-30 ENCOUNTER — Encounter (HOSPITAL_COMMUNITY)
Admission: RE | Admit: 2016-09-30 | Discharge: 2016-09-30 | Disposition: A | Discharge status: Home / Self Care | Payer: 59 | Source: Ambulatory Visit | Attending: Otolaryngology | Admitting: Otolaryngology

## 2016-09-30 ENCOUNTER — Encounter (HOSPITAL_COMMUNITY): Payer: Self-pay

## 2016-09-30 DIAGNOSIS — Z01812 Encounter for preprocedural laboratory examination: Secondary | ICD-10-CM | POA: Insufficient documentation

## 2016-09-30 HISTORY — DX: Hypothyroidism, unspecified: E03.9

## 2016-09-30 LAB — BASIC METABOLIC PANEL
Anion gap: 8 (ref 5–15)
BUN: 11 mg/dL (ref 6–20)
CO2: 28 mmol/L (ref 22–32)
Calcium: 9.3 mg/dL (ref 8.9–10.3)
Chloride: 103 mmol/L (ref 101–111)
Creatinine, Ser: 0.88 mg/dL (ref 0.61–1.24)
GFR calc Af Amer: >60 mL/min (ref >60)
GFR calc non Af Amer: >60 mL/min (ref >60)
Glucose, Bld: 169 mg/dL — ABNORMAL HIGH (ref 65–99)
Potassium: 3.9 mmol/L (ref 3.5–5.1)
Sodium: 139 mmol/L (ref 135–145)

## 2016-09-30 LAB — CBC
HCT: 38.2 % — ABNORMAL LOW (ref 39.0–52.0)
Hemoglobin: 13.4 g/dL (ref 13.0–17.0)
MCH: 30.3 pg (ref 26.0–34.0)
MCHC: 35.1 g/dL (ref 30.0–36.0)
MCV: 86.4 fL (ref 78.0–100.0)
Platelets: 240 10*3/uL (ref 150–400)
RBC: 4.42 MIL/uL (ref 4.22–5.81)
RDW: 12.7 % (ref 11.5–15.5)
WBC: 3.2 10*3/uL — ABNORMAL LOW (ref 4.0–10.5)

## 2016-09-30 NOTE — Pre-Procedure Instructions (Signed)
    [employer]  09/30/2016      [id number] [place] [place] [id number] [place] [id number] Phone: [phone] Fax: [phone]  [facility] [place] [place] [address] Phone: [phone] Fax: [phone]    Your procedure is scheduled on Wed. Dec. 20  Report to [facility] Admitting at 6:30A.M.  Call this number if you have problems the morning of surgery:  [phone]   Remember:  Do not eat food or drink liquids after midnight.  Take these medicines the morning of surgery with A SIP OF WATER: synthroid              1 week prio to surgery stop: aspirin, motrin, ibuprofen.  Advil, aleve, vitamins/herbal medicines   Do not wear jewelry.  Do not wear lotions, powders, or cologne, or deoderant.  Do not shave 48 hours prior to surgery.  Men may shave face and neck.  Do not bring valuables to the hospital.  [facility] is not responsible for any belongings or valuables.  Contacts, dentures or bridgework may not be worn into surgery.  Leave your suitcase in the car.  After surgery it may be brought to your room.  For patients admitted to the hospital, discharge time will be determined by your treatment team.  Patients discharged the day of surgery will not be allowed to drive home.   Name and phone number of your driver:   Special instructions:  Review preparing for surgery  Please read over the following fact sheets that you were given. Surgical Site Infection Prevention

## 2016-09-30 NOTE — Progress Notes (Addendum)
Left message @ Dr. Noreene Filbert office --no orders in epic for pre-admission appointment  PCP: Dr. Harle Battiest @ [place]

## 2016-10-01 ENCOUNTER — Other Ambulatory Visit (HOSPITAL_COMMUNITY): Payer: 59

## 2016-10-09 ENCOUNTER — Inpatient Hospital Stay (HOSPITAL_COMMUNITY): Payer: 59 | Admitting: Certified Registered Nurse Anesthetist

## 2016-10-09 ENCOUNTER — Observation Stay (HOSPITAL_COMMUNITY)
Admission: RE | Admit: 2016-10-09 | Discharge: 2016-10-10 | Disposition: A | Discharge status: Home / Self Care | Payer: 59 | Source: Ambulatory Visit | Attending: Otolaryngology | Admitting: Otolaryngology

## 2016-10-09 ENCOUNTER — Encounter (HOSPITAL_COMMUNITY): Payer: Self-pay | Admitting: *Deleted

## 2016-10-09 ENCOUNTER — Encounter (HOSPITAL_COMMUNITY)
Admission: RE | Disposition: A | Discharge status: Home / Self Care | Payer: Self-pay | Source: Ambulatory Visit | Attending: Otolaryngology

## 2016-10-09 DIAGNOSIS — Z79899 Other long term (current) drug therapy: Secondary | ICD-10-CM | POA: Diagnosis not present

## 2016-10-09 DIAGNOSIS — C73 Malignant neoplasm of thyroid gland: Secondary | ICD-10-CM

## 2016-10-09 DIAGNOSIS — E890 Postprocedural hypothyroidism: Secondary | ICD-10-CM | POA: Insufficient documentation

## 2016-10-09 DIAGNOSIS — Z8546 Personal history of malignant neoplasm of prostate: Secondary | ICD-10-CM | POA: Diagnosis not present

## 2016-10-09 HISTORY — PX: MASS EXCISION: SHX2000

## 2016-10-09 HISTORY — DX: Malignant neoplasm of thyroid gland: C73

## 2016-10-09 LAB — CALCIUM: Calcium: 8.6 mg/dL — ABNORMAL LOW (ref 8.9–10.3)

## 2016-10-09 SURGERY — EXCISION MASS
Anesthesia: General | Site: Neck | Laterality: Left

## 2016-10-09 MED ORDER — MEPERIDINE HCL 25 MG/ML IJ SOLN: 6.2500 mg | INTRAMUSCULAR | Status: DC | PRN

## 2016-10-09 MED ORDER — LIDOCAINE HCL (CARDIAC) 20 MG/ML IV SOLN
INTRAVENOUS | Status: DC | PRN
  Administered 2016-10-09: 20 mg via INTRAVENOUS

## 2016-10-09 MED ORDER — MIDAZOLAM HCL 5 MG/5ML IJ SOLN
INTRAMUSCULAR | Status: DC | PRN
  Administered 2016-10-09: 2 mg via INTRAVENOUS

## 2016-10-09 MED ORDER — MIDAZOLAM HCL 2 MG/2ML IJ SOLN: 0.5000 mg | Freq: Once | INTRAMUSCULAR | Status: DC | PRN

## 2016-10-09 MED ORDER — SUCCINYLCHOLINE CHLORIDE 200 MG/10ML IV SOSY
PREFILLED_SYRINGE | INTRAVENOUS | Status: AC
  Filled 2016-10-09: qty 10

## 2016-10-09 MED ORDER — PROPOFOL 10 MG/ML IV BOLUS
INTRAVENOUS | Status: AC
  Filled 2016-10-09: qty 20

## 2016-10-09 MED ORDER — LEVOTHYROXINE SODIUM 100 MCG PO TABS
125.0000 ug | ORAL_TABLET | Freq: Every day | ORAL | Status: DC
  Administered 2016-10-10: 125 ug via ORAL
  Filled 2016-10-09: qty 1

## 2016-10-09 MED ORDER — DEXTROSE-NACL 5-0.45 % IV SOLN: INTRAVENOUS | Status: DC

## 2016-10-09 MED ORDER — SUCCINYLCHOLINE CHLORIDE 200 MG/10ML IV SOSY
PREFILLED_SYRINGE | INTRAVENOUS | Status: DC | PRN
  Administered 2016-10-09: 140 mg via INTRAVENOUS

## 2016-10-09 MED ORDER — FENTANYL CITRATE (PF) 100 MCG/2ML IJ SOLN
INTRAMUSCULAR | Status: DC | PRN
  Administered 2016-10-09: 50 ug via INTRAVENOUS
  Administered 2016-10-09 (×2): 25 ug via INTRAVENOUS

## 2016-10-09 MED ORDER — HYDROCODONE-ACETAMINOPHEN 7.5-325 MG/15ML PO SOLN: 10.0000 mL | ORAL | Status: DC | PRN

## 2016-10-09 MED ORDER — PROPOFOL 10 MG/ML IV BOLUS
INTRAVENOUS | Status: DC | PRN
  Administered 2016-10-09: 150 mg via INTRAVENOUS

## 2016-10-09 MED ORDER — LACTATED RINGERS IV SOLN
INTRAVENOUS | Status: DC | PRN
Start: 2016-10-09 — End: 2016-10-09
  Administered 2016-10-09: 08:00:00 via INTRAVENOUS

## 2016-10-09 MED ORDER — EPHEDRINE 5 MG/ML INJ
INTRAVENOUS | Status: AC
  Filled 2016-10-09: qty 10

## 2016-10-09 MED ORDER — PROMETHAZINE HCL 25 MG/ML IJ SOLN: 6.2500 mg | INTRAMUSCULAR | Status: DC | PRN

## 2016-10-09 MED ORDER — ONDANSETRON HCL 4 MG/2ML IJ SOLN
INTRAMUSCULAR | Status: AC
  Filled 2016-10-09: qty 2

## 2016-10-09 MED ORDER — LIDOCAINE-EPINEPHRINE (PF) 1 %-1:200000 IJ SOLN
INTRAMUSCULAR | Status: AC
  Filled 2016-10-09: qty 30

## 2016-10-09 MED ORDER — ONDANSETRON HCL 4 MG/2ML IJ SOLN
INTRAMUSCULAR | Status: DC | PRN
  Administered 2016-10-09: 4 mg via INTRAVENOUS

## 2016-10-09 MED ORDER — HYDROMORPHONE HCL 1 MG/ML IJ SOLN: 0.2500 mg | INTRAMUSCULAR | Status: DC | PRN

## 2016-10-09 MED ORDER — IBUPROFEN 100 MG/5ML PO SUSP
400.0000 mg | Freq: Four times a day (QID) | ORAL | Status: DC | PRN
  Filled 2016-10-09: qty 20

## 2016-10-09 MED ORDER — LIDOCAINE-EPINEPHRINE (PF) 1 %-1:200000 IJ SOLN
INTRAMUSCULAR | Status: DC | PRN
  Administered 2016-10-09: 10 mL

## 2016-10-09 MED ORDER — PHENYLEPHRINE HCL 10 MG/ML IJ SOLN
INTRAVENOUS | Status: DC | PRN
  Administered 2016-10-09: 20 ug/min via INTRAVENOUS

## 2016-10-09 MED ORDER — FENTANYL CITRATE (PF) 100 MCG/2ML IJ SOLN
INTRAMUSCULAR | Status: AC
  Filled 2016-10-09: qty 4

## 2016-10-09 MED ORDER — MIDAZOLAM HCL 2 MG/2ML IJ SOLN
INTRAMUSCULAR | Status: AC
  Filled 2016-10-09: qty 2

## 2016-10-09 MED ORDER — BACITRACIN ZINC 500 UNIT/GM EX OINT
TOPICAL_OINTMENT | CUTANEOUS | Status: AC
  Filled 2016-10-09: qty 28.35

## 2016-10-09 MED ORDER — CALCIUM CARBONATE-VITAMIN D 500-200 MG-UNIT PO TABS
2.0000 | ORAL_TABLET | Freq: Two times a day (BID) | ORAL | Status: DC
  Administered 2016-10-09 – 2016-10-10 (×2): 2 via ORAL
  Filled 2016-10-09 (×2): qty 2

## 2016-10-09 SURGICAL SUPPLY — 55 items
APPLIER CLIP 9.375 SM OPEN (CLIP) ×2
ATTRACTOMAT 16X20 MAGNETIC DRP (DRAPES) IMPLANT
BLADE SURG 15 STRL LF DISP TIS (BLADE) IMPLANT
BLADE SURG 15 STRL SS (BLADE)
CANISTER SUCTION 2500CC (MISCELLANEOUS) ×2 IMPLANT
CLEANER TIP ELECTROSURG 2X2 (MISCELLANEOUS) ×2 IMPLANT
CLIP APPLIE 9.375 SM OPEN (CLIP) ×1 IMPLANT
CONT SPEC 4OZ CLIKSEAL STRL BL (MISCELLANEOUS) ×2 IMPLANT
CORDS BIPOLAR (ELECTRODE) IMPLANT
COVER SURGICAL LIGHT HANDLE (MISCELLANEOUS) ×2 IMPLANT
CRADLE DONUT ADULT HEAD (MISCELLANEOUS) IMPLANT
DERMABOND ADVANCED (GAUZE/BANDAGES/DRESSINGS) ×1
DERMABOND ADVANCED .7 DNX12 (GAUZE/BANDAGES/DRESSINGS) ×1 IMPLANT
DRAIN CHANNEL 10F 3/8 F FF (DRAIN) ×2 IMPLANT
DRAIN CHANNEL 15F RND FF W/TCR (WOUND CARE) IMPLANT
DRAIN PENROSE 1/2X12 LTX STRL (WOUND CARE) IMPLANT
DRAIN SNY 10 ROU (WOUND CARE) ×2 IMPLANT
DRAPE PROXIMA HALF (DRAPES) IMPLANT
ELECT COATED BLADE 2.86 ST (ELECTRODE) ×2 IMPLANT
ELECT REM PT RETURN 9FT ADLT (ELECTROSURGICAL) ×2
ELECTRODE REM PT RTRN 9FT ADLT (ELECTROSURGICAL) ×1 IMPLANT
EVACUATOR SILICONE 100CC (DRAIN) ×2 IMPLANT
GAUZE SPONGE 4X4 16PLY XRAY LF (GAUZE/BANDAGES/DRESSINGS) ×4 IMPLANT
GLOVE ECLIPSE 8.0 STRL XLNG CF (GLOVE) ×2 IMPLANT
GOWN STRL REUS W/ TWL LRG LVL3 (GOWN DISPOSABLE) ×1 IMPLANT
GOWN STRL REUS W/ TWL XL LVL3 (GOWN DISPOSABLE) ×1 IMPLANT
GOWN STRL REUS W/TWL LRG LVL3 (GOWN DISPOSABLE) ×1
GOWN STRL REUS W/TWL XL LVL3 (GOWN DISPOSABLE) ×1
KIT BASIN OR (CUSTOM PROCEDURE TRAY) ×2 IMPLANT
KIT ROOM TURNOVER OR (KITS) ×2 IMPLANT
LOCATOR NERVE 3 VOLT (DISPOSABLE) IMPLANT
NEEDLE HYPO 25GX1X1/2 BEV (NEEDLE) ×2 IMPLANT
NS IRRIG 1000ML POUR BTL (IV SOLUTION) ×2 IMPLANT
PAD ARMBOARD 7.5X6 YLW CONV (MISCELLANEOUS) ×4 IMPLANT
PENCIL BUTTON HOLSTER BLD 10FT (ELECTRODE) ×2 IMPLANT
PROBE NERVBE PRASS .33 (MISCELLANEOUS) ×2 IMPLANT
SHEARS HARMONIC 9CM CVD (BLADE) ×4 IMPLANT
SPECIMEN JAR MEDIUM (MISCELLANEOUS) ×2 IMPLANT
SPONGE INTESTINAL PEANUT (DISPOSABLE) IMPLANT
SPONGE LAP 18X18 X RAY DECT (DISPOSABLE) ×2 IMPLANT
STAPLER VISISTAT 35W (STAPLE) ×2 IMPLANT
SUT CHROMIC 4 0 PS 2 18 (SUTURE) ×4 IMPLANT
SUT CHROMIC 5 0 P 3 (SUTURE) IMPLANT
SUT ETHILON 3 0 PS 1 (SUTURE) ×2 IMPLANT
SUT ETHILON 4 0 PS 2 18 (SUTURE) ×2 IMPLANT
SUT ETHILON 5 0 PS 2 18 (SUTURE) ×2 IMPLANT
SUT SILK 2 0 REEL (SUTURE) IMPLANT
SUT SILK 2 0 SH CR/8 (SUTURE) ×2 IMPLANT
SUT SILK 3 0 REEL (SUTURE) ×2 IMPLANT
SUT SILK 4 0 REEL (SUTURE) ×2 IMPLANT
TOWEL OR 17X24 6PK STRL BLUE (TOWEL DISPOSABLE) ×2 IMPLANT
TRAY ENT MC OR (CUSTOM PROCEDURE TRAY) ×2 IMPLANT
TRAY FOLEY CATH 14FRSI W/METER (CATHETERS) IMPLANT
TUBE ENDOTRAC EMG 7X10.2 (MISCELLANEOUS) ×2 IMPLANT
WATER STERILE IRR 1000ML POUR (IV SOLUTION) ×2 IMPLANT

## 2016-10-09 NOTE — H&P (Signed)
Todd Wood, Todd Wood 55 y.o., male [id number]     Chief Complaint: residual thyroid cancer  HPI: 55 yo wm, underwent total thyroidectomy per Dr. Sula Rumple 2017 for papillary cancer.  12 of 12 + nodes.  Surgical report of positive residual tumor in thyroid bed.  Underwent RAI per Dr. Buddy Duty.  F/u reveals mod thyroglobulin, u/s shows mass LEFT tracheo-esophageal groove.  FNA + papillary ca.  Now admitted for re-excision residual papillary thyroid cancer. No hoarseness.   On synthroid.    PMH: Past Medical History:  Diagnosis Date  . Cancer (HCC)    hx of prostate cancer   . Hypothyroidism     Surg Hx: Past Surgical History:  Procedure Laterality Date  . PROSTATECTOMY    . THYROIDECTOMY N/A 11/16/2015   Procedure: TOTAL THYROIDECTOMY WITH CENTRAL COMPARTMENT LYMPH NODE DISSECTION;  Surgeon: Armandina Gemma, MD;  Location: [facility];  Service: General;  Laterality: N/A;    FHx:  History reviewed. No pertinent family history. SocHx:  reports that he has never smoked. He has never used smokeless tobacco. He reports that he drinks about 4.2 oz of alcohol per week . He reports that he does not use drugs.  ALLERGIES: No Known Allergies  Medications Prior to Admission  Medication Sig Dispense Refill  . SYNTHROID 125 MCG tablet Take 1 tablet (125 mcg total) by mouth daily before breakfast. 30 tablet 3    No results found for this or any previous visit (from the past 48 hour(s)). No results found.    Blood pressure 124/65, pulse 77, temperature 98.1 F (36.7 C), resp. rate 20, height 5' 11.25" (1.81 m), weight 100.7 kg (222 lb), SpO2 100 %.  PHYSICAL EXAM: Overall appearance: trim, healthy.  Voice strong and clear Head:  NCAT Ears: clear Nose: clear Oral Cavity:  clear Oral Pharynx/Hypopharynx/Larynx:  Vocal cords mobile bilateral.  Neuro: trace positive Chvostek's bilat. Neck:  Well healed scar.  No palpable mass.    Studies Reviewed:  CT neck    Assessment/Plan Papillary thyroid  cancer remnant.  For excision, probable repeat RAI post op.    Jodi Marble [id number], 8:25 AM

## 2016-10-09 NOTE — Transfer of Care (Signed)
Immediate Anesthesia Transfer of Care Note  Patient: Todd Wood  Procedure(s) Performed: Procedure(s): EXCISION RESIDUAL THYROID CANCER (Left)  Patient Location: PACU  Anesthesia Type:General  Level of Consciousness: awake, alert , oriented and patient cooperative  Airway & Oxygen Therapy: Patient Spontanous Breathing and Patient connected to nasal cannula oxygen  Post-op Assessment: Report given to RN, Post -op Vital signs reviewed and stable and Patient moving all extremities X 4  Post vital signs: Reviewed and stable  Last Vitals:  Vitals:   10/09/16 0659 10/09/16 1051  BP: 124/65   Pulse: 77   Resp: 20   Temp: 36.7 C (P) 36.8 C    Last Pain: There were no vitals filed for this visit.    Patients Stated Pain Goal: 5 ([id number])  Complications: No apparent anesthesia complications

## 2016-10-09 NOTE — Progress Notes (Signed)
Patient ID: Todd Wood, male   DOB: Dec 22, 1960, 55 y.o.   MRN: [id number] Doing well. No pain. No drainage  Wound looks excellent. Voice normal.   Continue care

## 2016-10-09 NOTE — Op Note (Signed)
10/09/2016  11:04 AM    Todd Wood  [id number]   Pre-Op Dx:  Residual papillary carcinoma, left thyroid  Post-op Dx: Same  Proc: Excision, residual carcinoma, left thyroid   Surg:  Tyson Alias MD  Assistant: Constance Holster M.D.  Anes:  GOT  EBL:  Minimal  Comp:  None  Findings:  Several surgical clips in the surgical field. Several firm lumps consistent with recurrent/persistent carcinoma.  One parathyroid identified and preserved. Recurrent laryngeal nerve identified and preserved. No lymphadenopathy identified.  Procedure: With the patient in a comfortable supine position, general orotracheal anesthesia was induced in the standard fashion using a nerve monitor tube.  A routine surgical timeout was performed.  A 40 French Maloney dilator was lubricated and passed into the esophagus for identification during surgery.  The preoperative orienting initial was identified in the previous surgical scar. The scar was infiltrated with 10 mL's of 1% Xylocaine with 1-200,000 epinephrine for intraoperative hemostasis. Several minutes were allowed for this to take effect.  A Hibiclens sterile preparation and draping of the anterior neck was accomplished.  CT scan was reviewed.  The previous wound was reopened sharply and carried down through platysma muscle and subcutaneous fat. There was significant scarring in the midline consistent with prior total thyroidectomy. Dissection dissection was carried down through the scar tissue in the midline raphae of the strap muscles as identified to the anterior face of the trachea. This was carried up to the cricoid cartilage. Dissection was begun of soft tissues away from the trachea itself.  A second approach was begun beneath the strap muscles down towards the carotid sheath. The carotid was identified by palpation but was not exposed. The dissection was carried medially from this point.  Working from the cricoid cartilage down along the  trachea, dissection was carried laterally. The recurrent laryngeal nerve was identified and carefully dissected. Surgical clips and small lumps consistent with recurrent cancer were identified. Dissection was carried out from superiorly downward. Fatty tissue in the anterior compartment was dissected down to the level of the subclavian artery. With the recurrent nerve under direct visualization, the soft tissue from the carotid sheath over to the trachea was gradually dissected. One parathyroid gland was identified and preserved. 1 lymphatic vessel was identified and secured with 3-0 silk ligatures. Several small vessels were controlled with the same silk. The specimen was examined and sent off for permanent interpretation. A small additional amount of tissue between the trachea and the nerve inferiorly was dissected and also included as the specimen. Careful palpation of the wound revealed no significant additional nodules or lymph nodes.  The wound was thoroughly rinsed and evacuated. A 10 [place] JP drain was brought out through a separate puncture wound and secured with a 4-0 nylon. This was laid into the left thyroid bed. The strap muscles were approximated in midline with a single 4-0 chromic suture. The shoulder roll was removed. The skin was reapproximated with 4-0 chromic on the subcutaneous layer and Dermabond on the skin. The drain was placed to suction and observed be functional. Hemostasis was observed.  The shoulder roll and the bougie were removed.  Patient was returned to anesthesia, awakened, extubated, and transferred to recovery in stable condition.  Dispo:   23 hour observation. Serial calcium checks. If he is doing nicely he can have the drain discontinued and discharge home in the morning.  Plan:  Await pathology report. Follow calcium. Recheck my office 2 weeks. He will need further attention from Dr.  Buddy Duty including possible additional radioactive iodine ablation.  Tyson Alias  MD

## 2016-10-09 NOTE — Anesthesia Procedure Notes (Signed)
Procedure Name: Intubation Date/Time: 10/09/2016 8:39 AM Performed by: Carney Living Pre-anesthesia Checklist: Patient identified, Emergency Drugs available, Suction available, Patient being monitored and Timeout performed Patient Re-evaluated:Patient Re-evaluated prior to inductionOxygen Delivery Method: Circle system utilized Preoxygenation: Pre-oxygenation with 100% oxygen Intubation Type: IV induction Laryngoscope Size: Mac and 4 Grade View: Grade I Tube type: Oral (NIMS ETT) Tube size: 7.0 mm Number of attempts: 1 Airway Equipment and Method: Stylet Placement Confirmation: ETT inserted through vocal cords under direct vision,  positive ETCO2 and breath sounds checked- equal and bilateral Secured at: 22 cm Tube secured with: Tape Dental Injury: Teeth and Oropharynx as per pre-operative assessment  Comments: NIMS monitoring ETT placed with contacts positioned in contact with vocal cords.

## 2016-10-09 NOTE — Discharge Instructions (Signed)
Keep head elevated x 3-4 nights OK to shower beginning Thursday No strenuous activity x 2 weeks I will call with the pathology report Call for bleeding, signs of infection, tingling around mouth or hands (a sign of low calcium) Os Cal calcium supplement Hydrocodone and/or Ibuprofen for pain relief Recheck my office 2 weeks please Diet as comfortable Continue synthroid

## 2016-10-09 NOTE — Anesthesia Preprocedure Evaluation (Addendum)
Anesthesia Evaluation  Patient identified by MRN, date of birth, ID band Patient awake    Reviewed: Allergy & Precautions, NPO status , Patient's Chart, lab work & pertinent test results  History of Anesthesia Complications Negative for: history of anesthetic complications  Airway Mallampati: II  TM Distance: >3 FB Neck ROM: Full    Dental  (+) Dental Advisory Given, Chipped   Pulmonary neg pulmonary ROS,    breath sounds clear to auscultation       Cardiovascular (-) anginanegative cardio ROS   Rhythm:Regular Rate:Normal     Neuro/Psych negative neurological ROS     GI/Hepatic negative GI ROS, Neg liver ROS,   Endo/Other  Hypothyroidism   Renal/GU negative Renal ROS     Musculoskeletal   Abdominal   Peds  Hematology negative hematology ROS (+)   Anesthesia Other Findings   Reproductive/Obstetrics                            Anesthesia Physical Anesthesia Plan  ASA: II  Anesthesia Plan: General   Post-op Pain Management:    Induction:   Airway Management Planned: Oral ETT  Additional Equipment:   Intra-op Plan:   Post-operative Plan: Extubation in OR  Informed Consent: I have reviewed the patients History and Physical, chart, labs and discussed the procedure including the risks, benefits and alternatives for the proposed anesthesia with the patient or authorized representative who has indicated his/her understanding and acceptance.   Dental advisory given  Plan Discussed with: CRNA, Anesthesiologist and Surgeon  Anesthesia Plan Comments: (Plan routine monitors, GETA)       Anesthesia Quick Evaluation

## 2016-10-09 NOTE — Anesthesia Postprocedure Evaluation (Signed)
Anesthesia Post Note  Patient: Todd Wood  Procedure(s) Performed: Procedure(s) (LRB): EXCISION RESIDUAL THYROID CANCER (Left)  Patient location during evaluation: PACU Anesthesia Type: General Level of consciousness: awake and alert, oriented and patient cooperative Pain management: pain level controlled Vital Signs Assessment: post-procedure vital signs reviewed and stable Respiratory status: spontaneous breathing, nonlabored ventilation and respiratory function stable Cardiovascular status: blood pressure returned to baseline and stable Postop Assessment: no signs of nausea or vomiting Anesthetic complications: no       Last Vitals:  Vitals:   10/09/16 1306 10/09/16 1400  BP: 125/71   Pulse: (!) 51 (!) 55  Resp: 17 14  Temp:      Last Pain: There were no vitals filed for this visit.               Midge Minium

## 2016-10-10 ENCOUNTER — Encounter (HOSPITAL_COMMUNITY): Payer: Self-pay | Admitting: Otolaryngology

## 2016-10-10 DIAGNOSIS — C73 Malignant neoplasm of thyroid gland: Secondary | ICD-10-CM | POA: Diagnosis not present

## 2016-10-10 LAB — CALCIUM: Calcium: 9.4 mg/dL (ref 8.9–10.3)

## 2016-10-10 NOTE — Progress Notes (Signed)
Dr. Erik Obey removed JP drain prior to patient being discharged.

## 2016-10-10 NOTE — Progress Notes (Signed)
Pt discharged home - discharge instructions and follow up appointments discussed with patient, patient states understanding of all through teach back. Family at bedside to drive patient home. All belongings sent with patient at time of discharge. IV access removed. No distress. VSS. No complaints. Alert, verbal, oriented x4, and appropriate at time of discharge. Taken via wheelchair to POV by NT.

## 2016-10-10 NOTE — Discharge Summary (Signed)
10/10/2016 11:21 AM  [name] [id number]  Post-Op Day 1    Temp:  [97.2 F (36.2 C)-98.4 F (36.9 C)] 98.4 F (36.9 C) (12/21 1014) Pulse Rate:  [50-64] 61 (12/21 1014) Resp:  [12-19] 18 (12/21 1014) BP: (113-129)/(64-83) 118/71 (12/21 1014) SpO2:  [97 %-100 %] 98 % (12/21 1014),     Intake/Output Summary (Last 24 hours) at 10/10/16 1121 Last data filed at 10/10/16 1002  Gross per 24 hour  Intake             1440 ml  Output             1422 ml  Net               18 ml    Results for orders placed or performed during the hospital encounter of 10/09/16 (from the past 24 hour(s))  Calcium     Status: Abnormal   Collection Time: 10/09/16  9:23 PM  Result Value Ref Range   Calcium 8.6 (L) 8.9 - 10.3 mg/dL  Calcium     Status: None   Collection Time: 10/10/16  4:54 AM  Result Value Ref Range   Calcium 9.4 8.9 - 10.3 mg/dL    SUBJECTIVE:  Min pain. Breathing well.  Swallowing well.  Spontaneous void.  ambulatory  OBJECTIVE:  wound flat,  Voice loud and clear.  No stridor.  Drain removed.  IMPRESSION:  Satisfactory check  PLAN:  Discharge home  Admit:  20 DEC Discharge: 21 DEC Final Diagnosis:  Recurrent thyroid cancer Proc:  Re-excision, recurrent thyroid cancer Comp:  None Cond:  Min pain.  Voice and breathing OK.  Ambulatory.  Ca++ OK.  Void OK.   [id number], Os-Cal Recheck:  2 weeks my office Instructions written and given  Hosp Course: underwent surgery on day of admission.  Observed 23 hr.  Min drainage.  Ca++ back up to 9.4  AM POD1.  Breathing and swallowing well.  Voice clear.  Drain removed and pt discharged to home and care of family on POD 1.     Jodi Marble

## 2017-08-14 DIAGNOSIS — Z23 Encounter for immunization: Secondary | ICD-10-CM | POA: Diagnosis not present

## 2017-09-08 DIAGNOSIS — C73 Malignant neoplasm of thyroid gland: Secondary | ICD-10-CM | POA: Diagnosis not present

## 2017-09-10 ENCOUNTER — Other Ambulatory Visit: Payer: Self-pay | Admitting: Internal Medicine

## 2017-09-10 DIAGNOSIS — C73 Malignant neoplasm of thyroid gland: Secondary | ICD-10-CM | POA: Diagnosis not present

## 2017-09-10 DIAGNOSIS — E890 Postprocedural hypothyroidism: Secondary | ICD-10-CM | POA: Diagnosis not present

## 2017-09-15 DIAGNOSIS — Z125 Encounter for screening for malignant neoplasm of prostate: Secondary | ICD-10-CM | POA: Diagnosis not present

## 2017-09-15 DIAGNOSIS — Z13220 Encounter for screening for lipoid disorders: Secondary | ICD-10-CM | POA: Diagnosis not present

## 2017-09-15 DIAGNOSIS — Z0000 Encounter for general adult medical examination without abnormal findings: Secondary | ICD-10-CM | POA: Diagnosis not present

## 2017-09-17 ENCOUNTER — Telehealth: Payer: Self-pay | Admitting: Internal Medicine

## 2017-09-19 ENCOUNTER — Ambulatory Visit
Admission: RE | Admit: 2017-09-19 | Discharge: 2017-09-19 | Disposition: A | Discharge status: Home / Self Care | Payer: 59 | Source: Ambulatory Visit | Attending: Internal Medicine | Admitting: Internal Medicine

## 2017-09-19 DIAGNOSIS — C73 Malignant neoplasm of thyroid gland: Secondary | ICD-10-CM

## 2017-09-19 DIAGNOSIS — E041 Nontoxic single thyroid nodule: Secondary | ICD-10-CM | POA: Diagnosis not present

## 2017-10-01 ENCOUNTER — Encounter: Payer: Self-pay | Admitting: Internal Medicine

## 2017-10-01 NOTE — Telephone Encounter (Signed)
Dr.Pyrtle reviewed records and accepted to see patient for office visit first. Patient has been scheduled for a new patient appointment with Dr.Pyrtle.

## 2017-11-24 ENCOUNTER — Encounter: Payer: Self-pay | Admitting: *Deleted

## 2017-12-01 ENCOUNTER — Encounter: Payer: Self-pay | Admitting: Internal Medicine

## 2017-12-01 ENCOUNTER — Ambulatory Visit (INDEPENDENT_AMBULATORY_CARE_PROVIDER_SITE_OTHER): Payer: BLUE CROSS/BLUE SHIELD | Admitting: Internal Medicine

## 2017-12-01 VITALS — BP 136/82 | HR 78 | Ht 71.25 in | Wt 226.5 lb

## 2017-12-01 DIAGNOSIS — Z1211 Encounter for screening for malignant neoplasm of colon: Secondary | ICD-10-CM

## 2017-12-01 DIAGNOSIS — Q438 Other specified congenital malformations of intestine: Secondary | ICD-10-CM | POA: Diagnosis not present

## 2017-12-01 MED ORDER — SUPREP BOWEL PREP KIT 17.5-3.13-1.6 GM/177ML PO SOLN: 1.0000 | ORAL | 0 refills | Status: DC

## 2017-12-01 NOTE — Patient Instructions (Addendum)
You have been scheduled for a colonoscopy. Please follow written instructions given to you at your visit today.  Please pick up your prep supplies at the pharmacy within the next 1-3 days. If you use inhalers (even only as needed), please bring them with you on the day of your procedure. Your physician has requested that you go to www.startemmi.com and enter the access code given to you at your visit today. This web site gives a general overview about your procedure. However, you should still follow specific instructions given to you by our office regarding your preparation for the procedure.  If you are age 68 or older, your body mass index should be between 23-30. Your Body mass index is 31.37 kg/m. If this is out of the aforementioned range listed, please consider follow up with your Primary Care Provider.  If you are age 29 or younger, your body mass index should be between 19-25. Your Body mass index is 31.37 kg/m. If this is out of the aformentioned range listed, please consider follow up with your Primary Care Provider.

## 2017-12-01 NOTE — Progress Notes (Signed)
Patient ID: Todd Wood, male   DOB: 1960/11/20, 58 y.o.   MRN: [id number] HPI: Todd Wood is a 57 yo male with a past medical history of prostate cancer status post robotic prostatectomy, papillary thyroid cancer status post prostatectomy and radioactive iodine treatment who is seen in consultation at the request of Dr. Harrington Challenger to discuss repeat screening colonoscopy.  He is here alone today.  He had a screening colonoscopy performed by Dr. Paulita Fujita on 05/12/2012.  This was an incomplete colonoscopy due to tortuosity.  Dr. Paulita Fujita estimated that the exam was to the transverse colon.  This was followed by a barium enema which was performed on 05/12/2012.  This was a negative exam but did show a markedly redundant sigmoid.  He reports that he has absolutely no GI complaint.  No abdominal pain.  No diarrhea, constipation, change in his bowel habits.  No blood in his stool or melena.  No upper GI or hepatobiliary complaint.  No family history of colon cancer.  Past Medical History:  Diagnosis Date  . Hypothyroidism   . Papillary thyroid carcinoma ([place])    stage III  . Prostate cancer ([facility]) 2007    Past Surgical History:  Procedure Laterality Date  . MASS EXCISION Left 10/09/2016   Procedure: EXCISION RESIDUAL THYROID CANCER;  Surgeon: Jodi Marble, MD;  Location: [facility];  Service: ENT;  Laterality: Left;  . PROSTATECTOMY    . THYROIDECTOMY N/A 11/16/2015   Procedure: TOTAL THYROIDECTOMY WITH CENTRAL COMPARTMENT LYMPH NODE DISSECTION;  Surgeon: Armandina Gemma, MD;  Location: [facility];  Service: General;  Laterality: N/A;    Outpatient Medications Prior to Visit  Medication Sig Dispense Refill  . SYNTHROID 125 MCG tablet Take 1 tablet (125 mcg total) by mouth daily before breakfast. 30 tablet 3   No facility-administered medications prior to visit.     No Known Allergies  Family History  Problem Relation Age of Onset  . Liver cancer Mother   . Alcohol abuse Father   . Prostate cancer Father   .  Diabetes Sister   . Colon cancer Neg Hx   . Colon polyps Neg Hx     Social History   Tobacco Use  . Smoking status: Never Smoker  . Smokeless tobacco: Never Used  Substance Use Topics  . Alcohol use: Yes    Alcohol/week: 4.2 oz    Types: 7 Cans of beer per week    Comment: weekly  . Drug use: No    ROS: As per history of present illness, otherwise negative  BP 136/82   Pulse 78   Ht 5' 11.25" (1.81 m)   Wt 226 lb 8 oz (102.7 kg)   BMI 31.37 kg/m  Constitutional: Well-developed and well-nourished. No distress. HEENT: Normocephalic and atraumatic. Oropharynx is clear and moist. Conjunctivae are normal.  No scleral icterus. Neck: Neck supple. Trachea midline. Cardiovascular: Normal rate, regular rhythm and intact distal pulses. No M/R/G Pulmonary/chest: Effort normal and breath sounds normal. No wheezing, rales or rhonchi. Abdominal: Soft, nontender, nondistended. Bowel sounds active throughout. There are no masses palpable. No hepatosplenomegaly. Extremities: no clubbing, cyanosis, or edema Neurological: Alert and oriented to person place and time. Skin: Skin is warm and dry.  Psychiatric: Normal mood and affect. Behavior is normal.   ASSESSMENT/PLAN: 57 yo male with a past medical history of prostate cancer status post robotic prostatectomy, papillary thyroid cancer status post prostatectomy and radioactive iodine treatment who is seen in consultation at the request of Dr. Harrington Challenger to  discuss repeat screening colonoscopy.  1.  Colon cancer screening/history of incomplete colonoscopy due to redundant sigmoid colon --average risk from a colon cancer standpoint however he does have a history of both prostate and thyroid cancer.  Both of these are currently under control/in remission.  We have discussed further colorectal cancer screening options including repeat attempt at colonoscopy, Cologuard (which would likely not be covered by his insurance) and CT colonoscopy.  After this  discussion including the risks, benefits and alternatives, he prefers to repeat colonoscopy.  He understands if this is incomplete that we would likely follow this by CT colonoscopy every 5 years.    YT:KPTW, Dwyane Luo, [place], [place] [id number]

## 2017-12-02 ENCOUNTER — Encounter: Payer: Self-pay | Admitting: Internal Medicine

## 2017-12-17 DIAGNOSIS — C770 Secondary and unspecified malignant neoplasm of lymph nodes of head, face and neck: Secondary | ICD-10-CM | POA: Diagnosis not present

## 2017-12-17 DIAGNOSIS — E890 Postprocedural hypothyroidism: Secondary | ICD-10-CM | POA: Diagnosis not present

## 2017-12-17 DIAGNOSIS — C73 Malignant neoplasm of thyroid gland: Secondary | ICD-10-CM | POA: Diagnosis not present

## 2017-12-18 ENCOUNTER — Other Ambulatory Visit (HOSPITAL_COMMUNITY): Payer: Self-pay | Admitting: Internal Medicine

## 2017-12-18 DIAGNOSIS — C73 Malignant neoplasm of thyroid gland: Secondary | ICD-10-CM

## 2017-12-23 ENCOUNTER — Encounter: Payer: BLUE CROSS/BLUE SHIELD | Admitting: Internal Medicine

## 2017-12-26 ENCOUNTER — Encounter (HOSPITAL_COMMUNITY)
Admission: RE | Admit: 2017-12-26 | Discharge: 2017-12-26 | Disposition: A | Discharge status: Home / Self Care | Payer: BLUE CROSS/BLUE SHIELD | Source: Ambulatory Visit | Attending: Internal Medicine | Admitting: Internal Medicine

## 2017-12-26 DIAGNOSIS — C73 Malignant neoplasm of thyroid gland: Secondary | ICD-10-CM

## 2017-12-26 MED ORDER — SODIUM IODIDE I 131 CAPSULE
123.9000 | Freq: Once | INTRAVENOUS | Status: AC | PRN
  Administered 2017-12-26: 123.9 via ORAL

## 2017-12-29 ENCOUNTER — Encounter: Payer: Self-pay | Admitting: Internal Medicine

## 2018-01-02 ENCOUNTER — Ambulatory Visit (HOSPITAL_COMMUNITY): Payer: BLUE CROSS/BLUE SHIELD

## 2018-01-05 ENCOUNTER — Ambulatory Visit (HOSPITAL_COMMUNITY)
Admission: RE | Admit: 2018-01-05 | Discharge: 2018-01-05 | Disposition: A | Discharge status: Home / Self Care | Payer: BLUE CROSS/BLUE SHIELD | Source: Ambulatory Visit | Attending: Internal Medicine | Admitting: Internal Medicine

## 2018-01-05 DIAGNOSIS — C73 Malignant neoplasm of thyroid gland: Secondary | ICD-10-CM | POA: Insufficient documentation

## 2018-01-12 ENCOUNTER — Other Ambulatory Visit (HOSPITAL_COMMUNITY): Payer: BLUE CROSS/BLUE SHIELD

## 2018-01-12 ENCOUNTER — Encounter: Payer: Self-pay | Admitting: Internal Medicine

## 2018-01-12 ENCOUNTER — Ambulatory Visit (AMBULATORY_SURGERY_CENTER): Payer: BLUE CROSS/BLUE SHIELD | Admitting: Internal Medicine

## 2018-01-12 VITALS — BP 119/77 | HR 52 | Temp 98.7°F | Resp 8 | Ht 71.0 in | Wt 226.0 lb

## 2018-01-12 DIAGNOSIS — D120 Benign neoplasm of cecum: Secondary | ICD-10-CM

## 2018-01-12 DIAGNOSIS — K635 Polyp of colon: Secondary | ICD-10-CM | POA: Diagnosis not present

## 2018-01-12 DIAGNOSIS — Z1211 Encounter for screening for malignant neoplasm of colon: Secondary | ICD-10-CM

## 2018-01-12 MED ORDER — SODIUM CHLORIDE 0.9 % IV SOLN: 500.0000 mL | Freq: Once | INTRAVENOUS | Status: DC

## 2018-01-12 NOTE — Op Note (Signed)
[facility] Patient Name: Todd Wood Procedure Date: 01/12/2018 1:54 PM MRN: [id number] Endoscopist: Jerene Bears , MD Age: 57 Referring MD:  Date of Birth: February 17, 1961 Gender: Male Account #: [id number] Procedure:                Colonoscopy Indications:              Screening for colorectal malignant neoplasm, Last                            colonoscopy: 2013 (incomplete) Medicines:                Monitored Anesthesia Care Procedure:                Pre-Anesthesia Assessment:                           - Prior to the procedure, a History and Physical                            was performed, and patient medications and                            allergies were reviewed. The patient's tolerance of                            previous anesthesia was also reviewed. The risks                            and benefits of the procedure and the sedation                            options and risks were discussed with the patient.                            All questions were answered, and informed consent                            was obtained. Prior Anticoagulants: The patient has                            taken no previous anticoagulant or antiplatelet                            agents. ASA Grade Assessment: II - A patient with                            mild systemic disease. After reviewing the risks                            and benefits, the patient was deemed in                            satisfactory condition to undergo the procedure.  After obtaining informed consent, the colonoscope                            was passed under direct vision. Throughout the                            procedure, the patient's blood pressure, pulse, and                            oxygen saturations were monitored continuously. The                            Model CF-HQ190L [id number]) scope was introduced                            through the anus and advanced to  the the cecum,                            identified by appendiceal orifice and ileocecal                            valve. The colonoscopy was performed without                            difficulty. The patient tolerated the procedure                            well. The quality of the bowel preparation was                            good. The ileocecal valve, appendiceal orifice, and                            rectum were photographed. Scope In: 2:04:22 PM Scope Out: 2:28:04 PM Scope Withdrawal Time: 0 hours 16 minutes 44 seconds  Total Procedure Duration: 0 hours 23 minutes 42 seconds  Findings:                 The digital rectal exam was normal.                           The colon (entire examined portion) was moderately                            redundant.                           Two sessile polyps were found in the cecum. The                            polyps were 4 to 5 mm in size. These polyps were                            removed with a cold snare. Resection and retrieval  were complete. Complications:            No immediate complications. Estimated Blood Loss:     Estimated blood loss was minimal. Impression:               - Two 4 to 5 mm polyps in the cecum, removed with a                            cold snare. Resected and retrieved. Recommendation:           - Patient has a contact number available for                            emergencies. The signs and symptoms of potential                            delayed complications were discussed with the                            patient. Return to normal activities tomorrow.                            Written discharge instructions were provided to the                            patient.                           - Resume previous diet.                           - Continue present medications.                           - Await pathology results.                           - Repeat colonoscopy is  recommended. The                            colonoscopy date will be determined after pathology                            results from today's exam become available for                            review. Jerene Bears, MD 01/12/2018 2:35:31 PM This report has been signed electronically.

## 2018-01-12 NOTE — Patient Instructions (Signed)
Information on polyps given.   YOU HAD AN ENDOSCOPIC PROCEDURE TODAY AT THE [facility]:   Refer to the procedure report that was given to you for any specific questions about what was found during the examination.  If the procedure report does not answer your questions, please call your gastroenterologist to clarify.  If you requested that your care partner not be given the details of your procedure findings, then the procedure report has been included in a sealed envelope for you to review at your convenience later.  YOU SHOULD EXPECT: Some feelings of bloating in the abdomen. Passage of more gas than usual.  Walking can help get rid of the air that was put into your GI tract during the procedure and reduce the bloating. If you had a lower endoscopy (such as a colonoscopy or flexible sigmoidoscopy) you may notice spotting of blood in your stool or on the toilet paper. If you underwent a bowel prep for your procedure, you may not have a normal bowel movement for a few days.  Please Note:  You might notice some irritation and congestion in your nose or some drainage.  This is from the oxygen used during your procedure.  There is no need for concern and it should clear up in a day or so.  SYMPTOMS TO REPORT IMMEDIATELY:   Following lower endoscopy (colonoscopy or flexible sigmoidoscopy):  Excessive amounts of blood in the stool  Significant tenderness or worsening of abdominal pains  Swelling of the abdomen that is new, acute  Fever of 100F or higher     For urgent or emergent issues, a gastroenterologist can be reached at any hour by calling [phone].   DIET:  We do recommend a small meal at first, but then you may proceed to your regular diet.  Drink plenty of fluids but you should avoid alcoholic beverages for 24 hours.  ACTIVITY:  You should plan to take it easy for the rest of today and you should NOT DRIVE or use heavy machinery until tomorrow (because of the  sedation medicines used during the test).    FOLLOW UP: Our staff will call the number listed on your records the next business day following your procedure to check on you and address any questions or concerns that you may have regarding the information given to you following your procedure. If we do not reach you, we will leave a message.  However, if you are feeling well and you are not experiencing any problems, there is no need to return our call.  We will assume that you have returned to your regular daily activities without incident.  If any biopsies were taken you will be contacted by phone or by letter within the next 1-3 weeks.  Please call us at [phone] if you have not heard about the biopsies in 3 weeks.    SIGNATURES/CONFIDENTIALITY: You and/or your care partner have signed paperwork which will be entered into your electronic medical record.  These signatures attest to the fact that that the information above on your After Visit Summary has been reviewed and is understood.  Full responsibility of the confidentiality of this discharge information lies with you and/or your care-partner. 

## 2018-01-12 NOTE — Progress Notes (Signed)
Pt's states no medical or surgical changes since previsit or office visit. 

## 2018-01-12 NOTE — Progress Notes (Signed)
A and O x3. Report to RN. Tolerated MAC anesthesia well.

## 2018-01-12 NOTE — Progress Notes (Signed)
Called to room to assist during endoscopic procedure.  Patient ID and intended procedure confirmed with present staff. Received instructions for my participation in the procedure from the performing physician.  

## 2018-01-13 ENCOUNTER — Telehealth: Payer: Self-pay

## 2018-01-13 NOTE — Telephone Encounter (Signed)
  Follow up Call-  Call back number 01/12/2018  Post procedure Call Back phone  # [phone]  Permission to leave phone message Yes  Some recent data might be hidden     Patient questions:  Do you have a fever, pain , or abdominal swelling? No. Pain Score  0 *  Have you tolerated food without any problems? Yes.    Have you been able to return to your normal activities? Yes.    Do you have any questions about your discharge instructions: Diet   No. Medications  No. Follow up visit  No.  Do you have questions or concerns about your Care? No.  Actions: * If pain score is 4 or above: No action needed, pain <4.

## 2018-01-19 ENCOUNTER — Encounter: Payer: Self-pay | Admitting: Internal Medicine

## 2018-05-25 DIAGNOSIS — M62830 Muscle spasm of back: Secondary | ICD-10-CM | POA: Diagnosis not present

## 2018-08-31 ENCOUNTER — Encounter: Payer: Self-pay | Admitting: Family Medicine

## 2018-09-07 DIAGNOSIS — C73 Malignant neoplasm of thyroid gland: Secondary | ICD-10-CM | POA: Diagnosis not present

## 2018-09-11 ENCOUNTER — Other Ambulatory Visit: Payer: Self-pay | Admitting: Internal Medicine

## 2018-09-11 DIAGNOSIS — E890 Postprocedural hypothyroidism: Secondary | ICD-10-CM | POA: Diagnosis not present

## 2018-09-11 DIAGNOSIS — Z8546 Personal history of malignant neoplasm of prostate: Secondary | ICD-10-CM | POA: Diagnosis not present

## 2018-09-11 DIAGNOSIS — C73 Malignant neoplasm of thyroid gland: Secondary | ICD-10-CM | POA: Diagnosis not present

## 2018-09-11 DIAGNOSIS — Z125 Encounter for screening for malignant neoplasm of prostate: Secondary | ICD-10-CM | POA: Diagnosis not present

## 2018-09-11 DIAGNOSIS — Z23 Encounter for immunization: Secondary | ICD-10-CM | POA: Diagnosis not present

## 2018-09-14 ENCOUNTER — Ambulatory Visit
Admission: RE | Admit: 2018-09-14 | Discharge: 2018-09-14 | Disposition: A | Discharge status: Home / Self Care | Payer: BLUE CROSS/BLUE SHIELD | Source: Ambulatory Visit | Attending: Internal Medicine | Admitting: Internal Medicine

## 2018-09-14 DIAGNOSIS — C73 Malignant neoplasm of thyroid gland: Secondary | ICD-10-CM | POA: Diagnosis not present

## 2018-09-15 ENCOUNTER — Other Ambulatory Visit: Payer: Self-pay | Admitting: Internal Medicine

## 2018-09-15 DIAGNOSIS — C73 Malignant neoplasm of thyroid gland: Secondary | ICD-10-CM

## 2018-09-21 DIAGNOSIS — D2272 Melanocytic nevi of left lower limb, including hip: Secondary | ICD-10-CM | POA: Diagnosis not present

## 2018-09-21 DIAGNOSIS — D225 Melanocytic nevi of trunk: Secondary | ICD-10-CM | POA: Diagnosis not present

## 2018-09-21 DIAGNOSIS — L570 Actinic keratosis: Secondary | ICD-10-CM | POA: Diagnosis not present

## 2018-09-21 DIAGNOSIS — D229 Melanocytic nevi, unspecified: Secondary | ICD-10-CM | POA: Diagnosis not present

## 2018-09-21 DIAGNOSIS — D2271 Melanocytic nevi of right lower limb, including hip: Secondary | ICD-10-CM | POA: Diagnosis not present

## 2018-09-22 ENCOUNTER — Other Ambulatory Visit (HOSPITAL_COMMUNITY): Payer: Self-pay | Admitting: Internal Medicine

## 2018-09-22 DIAGNOSIS — C73 Malignant neoplasm of thyroid gland: Secondary | ICD-10-CM

## 2018-09-25 ENCOUNTER — Other Ambulatory Visit: Payer: Self-pay | Admitting: Radiology

## 2018-09-25 ENCOUNTER — Other Ambulatory Visit: Payer: Self-pay | Admitting: Student

## 2018-09-28 ENCOUNTER — Other Ambulatory Visit: Payer: Self-pay

## 2018-09-28 ENCOUNTER — Ambulatory Visit (HOSPITAL_COMMUNITY)
Admission: RE | Admit: 2018-09-28 | Discharge: 2018-09-28 | Disposition: A | Discharge status: Home / Self Care | Payer: BLUE CROSS/BLUE SHIELD | Source: Ambulatory Visit | Attending: Internal Medicine | Admitting: Internal Medicine

## 2018-09-28 DIAGNOSIS — C73 Malignant neoplasm of thyroid gland: Secondary | ICD-10-CM

## 2018-09-28 DIAGNOSIS — R221 Localized swelling, mass and lump, neck: Secondary | ICD-10-CM | POA: Diagnosis not present

## 2018-09-28 DIAGNOSIS — C770 Secondary and unspecified malignant neoplasm of lymph nodes of head, face and neck: Secondary | ICD-10-CM | POA: Insufficient documentation

## 2018-09-28 LAB — CBC
HCT: 40 % (ref 39.0–52.0)
Hemoglobin: 13.7 g/dL (ref 13.0–17.0)
MCH: 30.2 pg (ref 26.0–34.0)
MCHC: 34.3 g/dL (ref 30.0–36.0)
MCV: 88.3 fL (ref 80.0–100.0)
Platelets: 241 10*3/uL (ref 150–400)
RBC: 4.53 MIL/uL (ref 4.22–5.81)
RDW: 12.1 % (ref 11.5–15.5)
WBC: 2.9 10*3/uL — ABNORMAL LOW (ref 4.0–10.5)
nRBC: 0 % (ref 0.0–0.2)

## 2018-09-28 LAB — APTT: aPTT: 36 seconds (ref 24–36)

## 2018-09-28 LAB — PROTIME-INR
INR: 1.03
Prothrombin Time: 13.4 seconds (ref 11.4–15.2)

## 2018-09-28 MED ORDER — LIDOCAINE HCL (PF) 1 % IJ SOLN
INTRAMUSCULAR | Status: AC
  Filled 2018-09-28: qty 30

## 2018-09-28 MED ORDER — MIDAZOLAM HCL 2 MG/2ML IJ SOLN
INTRAMUSCULAR | Status: AC
  Filled 2018-09-28: qty 2

## 2018-09-28 MED ORDER — SODIUM CHLORIDE 0.9 % IV SOLN: INTRAVENOUS | Status: DC

## 2018-09-28 MED ORDER — FENTANYL CITRATE (PF) 100 MCG/2ML IJ SOLN
INTRAMUSCULAR | Status: AC
  Filled 2018-09-28: qty 2

## 2018-09-28 NOTE — Sedation Documentation (Signed)
Decided between patient and Dr Anselm Pancoast to not sedated patient

## 2018-09-28 NOTE — Procedures (Signed)
Interventional Radiology Procedure:   Indications: Concern for recurrent papillary thyroid cancer  Procedure: US guided lymph node FNA  Findings: Abnormal lymph node/nodule adjacent to the left IJ.  6 FNAs obtained  Complications: None     EBL: None  Plan: Discharge to home.   Docie Abramovich R. Anselm Pancoast, MD  Pager: [phone]

## 2018-09-28 NOTE — H&P (Signed)
Chief Complaint: Lymph node vs thyroid bed nodule  Referring Physician(s): Kerr,Jeffrey  Supervising Physician: Markus Daft  Patient Status: [facility] - Out-pt  History of Present Illness: Todd Wood is a 57 y.o. male with history of total thyroidectomy by Dr. Harlow Asa back in January of 2017 for papillary cancer.  12 out of 12 nodes were positive.  He was operated on again by Dr. Erik Obey in December of 2017 for re-excision of residual papillary thyroid cancer.  US done 09/14/2018 for follow up showed Nodule versus pathologic lymph node lateral to the left thyroidectomy bed.  He is here today for US guided biopsy.  He is NPO. No blood thinners.  Past Medical History:  Diagnosis Date  . Hypothyroidism   . Papillary thyroid carcinoma ([facility])    stage III  . Prostate cancer ([facility]) 2007    Past Surgical History:  Procedure Laterality Date  . MASS EXCISION Left 10/09/2016   Procedure: EXCISION RESIDUAL THYROID CANCER;  Surgeon: Jodi Marble, MD;  Location: [facility];  Service: ENT;  Laterality: Left;  . PROSTATECTOMY    . THYROIDECTOMY N/A 11/16/2015   Procedure: TOTAL THYROIDECTOMY WITH CENTRAL COMPARTMENT LYMPH NODE DISSECTION;  Surgeon: Armandina Gemma, MD;  Location: [facility];  Service: General;  Laterality: N/A;    Allergies: Patient has no known allergies.  Medications: Prior to Admission medications   Medication Sig Start Date End Date Taking? Authorizing Provider  SYNTHROID 125 MCG tablet Take 1 tablet (125 mcg total) by mouth daily before breakfast. 11/17/15  Yes Armandina Gemma, MD     Family History  Problem Relation Age of Onset  . Liver cancer Mother   . Alcohol abuse Father   . Prostate cancer Father   . Diabetes Sister   . Colon cancer Neg Hx   . Colon polyps Neg Hx     Social History   Socioeconomic History  . Marital status: Married    Spouse name: Not on file  . Number of children: 4  . Years of education: Not on file  . Highest education level: Not on  file  Occupational History  . Not on file  Social Needs  . Financial resource strain: Not on file  . Food insecurity:    Worry: Not on file    Inability: Not on file  . Transportation needs:    Medical: Not on file    Non-medical: Not on file  Tobacco Use  . Smoking status: Never Smoker  . Smokeless tobacco: Never Used  Substance and Sexual Activity  . Alcohol use: Yes    Alcohol/week: 7.0 standard drinks    Types: 7 Cans of beer per week    Comment: weekly  . Drug use: No  . Sexual activity: Not on file  Lifestyle  . Physical activity:    Days per week: Not on file    Minutes per session: Not on file  . Stress: Not on file  Relationships  . Social connections:    Talks on phone: Not on file    Gets together: Not on file    Attends religious service: Not on file    Active member of club or organization: Not on file    Attends meetings of clubs or organizations: Not on file    Relationship status: Not on file  Other Topics Concern  . Not on file  Social History Narrative  . Not on file     Review of Systems: A 12 point ROS discussed and  pertinent positives are indicated in the HPI above.  All other systems are negative.  Review of Systems  Vital Signs: BP (!) 144/85   Pulse (!) 57   Temp 97.7 F (36.5 C) (Oral)   Ht 6' (1.829 m)   Wt 95.3 kg   SpO2 99%   BMI 28.48 kg/m   Physical Exam  Constitutional: He is oriented to person, place, and time. He appears well-developed.  HENT:  Head: Normocephalic and atraumatic.  Eyes: EOM are normal.  Neck: Normal range of motion.  Cardiovascular: Normal rate, regular rhythm and normal heart sounds.  Pulmonary/Chest: Effort normal.  Abdominal: Soft.  Musculoskeletal: Normal range of motion.  Neurological: He is alert and oriented to person, place, and time.  Skin: Skin is warm and dry.  Psychiatric: He has a normal mood and affect. His behavior is normal. Judgment and thought content normal.  Vitals  reviewed.   Imaging: US Thyroid  Result Date: 09/14/2018 CLINICAL DATA:  Papillary thyroid carcinoma post thyroidectomy EXAM: THYROID ULTRASOUND TECHNIQUE: Ultrasound examination of the thyroidectomy bed and adjacent soft tissues was performed. COMPARISON:  Scintigraphy 01/05/2018, ultrasound 09/19/2017 FINDINGS: Parenchymal Echotexture: None seen Isthmus: Surgically absent Right lobe: Surgically absent Left lobe: Surgically absent _________________________________________________________ Estimated total number of nodules >/= 1 cm: 1 Number of spongiform nodules >/=  2 cm not described below (TR1): 0 Number of mixed cystic and solid nodules >/= 1.5 cm not described below (TR2): 0 _________________________________________________________ There is a 0.7 x 0.5 cm hypoechoic nodule in the inferior aspect of the thyroidectomy bed on the right. 1.5 x 0.9 x 0.8 cm nodule or pathologic lymph node lateral to the left thyroidectomy bed. IMPRESSION: 1. Nodule versus pathologic lymph node lateral to the left thyroidectomy bed, corresponding to focus of activity seen on prior scintigraphy. Consider excisional or FNA biopsy. The above is in keeping with the ACR TI-RADS recommendations - J Am Coll Radiol 2017;14:587-595. Electronically Signed   By: Lucrezia Europe M.D.   On: 09/14/2018 16:55    Labs:  CBC: Recent Labs    09/28/18 1148  WBC 2.9*  HGB 13.7  HCT 40.0  PLT 241    COAGS: No results for input(s): INR, APTT in the last 8760 hours.  BMP: No results for input(s): NA, K, CL, CO2, GLUCOSE, BUN, CALCIUM, CREATININE, GFRNONAA, GFRAA in the last 8760 hours.  Invalid input(s): CMP  LIVER FUNCTION TESTS: No results for input(s): BILITOT, AST, ALT, ALKPHOS, PROT, ALBUMIN in the last 8760 hours.  TUMOR MARKERS: No results for input(s): AFPTM, CEA, CA199, CHROMGRNA in the last 8760 hours.  Assessment and Plan:  Nodule versus pathologic lymph node lateral to the left thyroidectomy bed. In a patient  with 2 previous surgeries for papillary thyroid cancer.  Will proceed with image guided biopsy today by Dr. Anselm Pancoast.  Risks and benefits discussed with the patient including, but not limited to bleeding, infection, damage to adjacent structures or low yield requiring additional tests.  All of the patient's questions were answered, patient is agreeable to proceed. Consent signed and in chart.  Thank you for this interesting consult.  I greatly enjoyed meeting [employer] and look forward to participating in their care.  A copy of this report was sent to the requesting provider on this date.  Electronically Signed: Murrell Redden, PA-C   09/28/2018, 12:05 PM      I spent a total of  30 Minutes in face to face in clinical consultation, greater than 50% of which  was counseling/coordinating care for US guided biopsy.

## 2018-09-28 NOTE — Sedation Documentation (Signed)
Discharged walking with wife. Feels "great".

## 2018-09-28 NOTE — Progress Notes (Signed)
Checked with Gareth Eagle, PA re airway and ASA. Patient is ASA 3 and Airway 2, as per verbal Maximiano Coss PA/ L. Rudene Christians RN

## 2018-10-09 ENCOUNTER — Other Ambulatory Visit: Payer: Self-pay | Admitting: Internal Medicine

## 2018-10-09 DIAGNOSIS — C73 Malignant neoplasm of thyroid gland: Secondary | ICD-10-CM

## 2018-10-09 DIAGNOSIS — C770 Secondary and unspecified malignant neoplasm of lymph nodes of head, face and neck: Secondary | ICD-10-CM

## 2018-10-19 ENCOUNTER — Ambulatory Visit
Admission: RE | Admit: 2018-10-19 | Discharge: 2018-10-19 | Disposition: A | Discharge status: Home / Self Care | Payer: BLUE CROSS/BLUE SHIELD | Source: Ambulatory Visit | Attending: Internal Medicine | Admitting: Internal Medicine

## 2018-10-19 DIAGNOSIS — C770 Secondary and unspecified malignant neoplasm of lymph nodes of head, face and neck: Secondary | ICD-10-CM

## 2018-10-19 DIAGNOSIS — C73 Malignant neoplasm of thyroid gland: Secondary | ICD-10-CM

## 2018-10-19 MED ORDER — IOPAMIDOL (ISOVUE-300) INJECTION 61%
125.0000 mL | Freq: Once | INTRAVENOUS | Status: AC | PRN
  Administered 2018-10-19: 125 mL via INTRAVENOUS

## 2018-10-26 DIAGNOSIS — Z0000 Encounter for general adult medical examination without abnormal findings: Secondary | ICD-10-CM | POA: Diagnosis not present

## 2018-10-27 DIAGNOSIS — C73 Malignant neoplasm of thyroid gland: Secondary | ICD-10-CM | POA: Diagnosis not present

## 2018-10-27 DIAGNOSIS — E890 Postprocedural hypothyroidism: Secondary | ICD-10-CM | POA: Diagnosis not present

## 2018-11-10 ENCOUNTER — Other Ambulatory Visit (HOSPITAL_COMMUNITY): Payer: Self-pay | Admitting: Internal Medicine

## 2018-11-10 DIAGNOSIS — C73 Malignant neoplasm of thyroid gland: Secondary | ICD-10-CM

## 2018-11-27 DIAGNOSIS — C73 Malignant neoplasm of thyroid gland: Secondary | ICD-10-CM | POA: Diagnosis not present

## 2018-11-30 ENCOUNTER — Ambulatory Visit (HOSPITAL_COMMUNITY)
Admission: RE | Admit: 2018-11-30 | Discharge: 2018-11-30 | Disposition: A | Discharge status: Home / Self Care | Payer: BLUE CROSS/BLUE SHIELD | Source: Ambulatory Visit | Attending: Internal Medicine | Admitting: Internal Medicine

## 2018-11-30 DIAGNOSIS — C73 Malignant neoplasm of thyroid gland: Secondary | ICD-10-CM | POA: Diagnosis not present

## 2018-11-30 MED ORDER — SODIUM IODIDE I 131 CAPSULE
3.0000 | Freq: Once | INTRAVENOUS | Status: AC | PRN
  Administered 2018-11-30: 3 via ORAL

## 2018-12-03 ENCOUNTER — Ambulatory Visit (HOSPITAL_COMMUNITY)
Admission: RE | Admit: 2018-12-03 | Discharge: 2018-12-03 | Disposition: A | Discharge status: Home / Self Care | Payer: BLUE CROSS/BLUE SHIELD | Source: Ambulatory Visit | Attending: Internal Medicine | Admitting: Internal Medicine

## 2018-12-03 DIAGNOSIS — Z85850 Personal history of malignant neoplasm of thyroid: Secondary | ICD-10-CM | POA: Diagnosis not present

## 2018-12-28 DIAGNOSIS — E079 Disorder of thyroid, unspecified: Secondary | ICD-10-CM | POA: Diagnosis not present

## 2018-12-28 DIAGNOSIS — C799 Secondary malignant neoplasm of unspecified site: Secondary | ICD-10-CM | POA: Diagnosis not present

## 2018-12-28 DIAGNOSIS — C73 Malignant neoplasm of thyroid gland: Secondary | ICD-10-CM | POA: Diagnosis not present

## 2018-12-31 DIAGNOSIS — C73 Malignant neoplasm of thyroid gland: Secondary | ICD-10-CM | POA: Diagnosis not present

## 2018-12-31 DIAGNOSIS — C799 Secondary malignant neoplasm of unspecified site: Secondary | ICD-10-CM | POA: Diagnosis not present

## 2019-01-04 NOTE — Pre-Procedure Instructions (Signed)
[employer]  01/04/2019      [id number] [place], [place] [address] [place] [address] Phone: [phone] Fax: [phone]  [facility] [place] [place] [address] Phone: [phone] Fax: [phone]    Your procedure is scheduled on Mar. 18  Report to [facility] Entrance A at 8:30 A.M.  Call this number if you have problems the morning of surgery:  [phone]   Remember:  Do not eat or drink after midnight.      Take these medicines the morning of surgery with A SIP OF WATER :             Synthroid             7 days prior to surgery STOP taking any Aspirin (unless otherwise instructed by your surgeon), Aleve, Naproxen, Ibuprofen, Motrin, Advil, Goody's, BC's, all herbal medications, fish oil, and all vitamins.    Do not wear jewelry.  Do not wear lotions, powders, or perfumes, or deodorant.  Do not shave 48 hours prior to surgery.  Men may shave face and neck.  Do not bring valuables to the hospital.  [facility] is not responsible for any belongings or valuables.  Contacts, dentures or bridgework may not be worn into surgery.  Leave your suitcase in the car.  After surgery it may be brought to your room.  For patients admitted to the hospital, discharge time will be determined by your treatment team.  Patients discharged the day of surgery will not be allowed to drive home.    Special instructions:  [facility]- Preparing For Surgery  Before surgery, you can play an important role. Because skin is not sterile, your skin needs to be as free of germs as possible. You can reduce the number of germs on your skin by washing with CHG (chlorahexidine gluconate) Soap before surgery.  CHG is an antiseptic cleaner which kills germs and bonds with the skin to continue killing germs even after washing.    Oral Hygiene is also important to reduce your risk of  infection.  Remember - BRUSH YOUR TEETH THE MORNING OF SURGERY WITH YOUR REGULAR TOOTHPASTE  Please do not use if you have an allergy to CHG or antibacterial soaps. If your skin becomes reddened/irritated stop using the CHG.  Do not shave (including legs and underarms) for at least 48 hours prior to first CHG shower. It is OK to shave your face.  Please follow these instructions carefully.   1. Shower the NIGHT BEFORE SURGERY and the MORNING OF SURGERY with CHG.   2. If you chose to wash your hair, wash your hair first as usual with your normal shampoo.  3. After you shampoo, rinse your hair and body thoroughly to remove the shampoo.  4. Use CHG as you would any other liquid soap. You can apply CHG directly to the skin and wash gently with a scrungie or a clean washcloth.   5. Apply the CHG Soap to your body ONLY FROM THE NECK DOWN.  Do not use on open wounds or open sores. Avoid contact with your eyes, ears, mouth and genitals (private parts). Wash Face and genitals (private parts)  with your normal soap.  6. Wash thoroughly, paying special attention to the area where your surgery will be performed.  7. Thoroughly rinse your body with warm water from the neck down.  8.  DO NOT shower/wash with your normal soap after using and rinsing off the CHG Soap.  9. Pat yourself dry with a CLEAN TOWEL.  10. Wear CLEAN PAJAMAS to bed the night before surgery, wear comfortable clothes the morning of surgery  11. Place CLEAN SHEETS on your bed the night of your first shower and DO NOT SLEEP WITH PETS.    Day of Surgery:  Do not apply any deodorants/lotions.  Please wear clean clothes to the hospital/surgery center.   Remember to brush your teeth WITH YOUR REGULAR TOOTHPASTE.    Please read over the following fact sheets that you were given. Coughing and Deep Breathing and Surgical Site Infection Prevention

## 2019-01-04 NOTE — H&P (Signed)
HPI:   Todd Wood is a 58 y.o. male patient of Lawerance Cruel, MD for evaluation of recurrent thyroid cancer. 2+ year return visit. He had a total thyroidectomy with central compartment dissection per Dr. Harlow Asa in January 2017. This was multifocal papillary carcinoma with positive margins, a 4 cm left dominant nodule, and 12/12+ lymph nodes. Later in 2017 he developed a lump in the left thyroid bed which showed calcification. Radiology thought this might be necrotic fat. Needle aspiration was positive for papillary carcinoma. A wide local excision in the area preserved 1 parathyroid gland, preserved the recurrent laryngeal nerve on the left side, and was pathologically positive although the report did not make the distinction about number of sites or lymph nodes. He has done well since that time. He has been serially followed with Dr. Buddy Duty. Recently, thyroglobulin was rising. the CT scan of the neck showed 2 calcified lesions on the left, one consistent with a 12 mm level 4 lymph node anterior to the jugular vein, and a 6 mm lesion in the tracheoesophageal groove approximately posterior to the carotid artery. No tissue confirmation. CT scan of the chest was of no concern. Iodine scan was negative. He is here for consideration of further surgery. He is having no symptoms including dysphagia, hoarseness, or pain. PMH/Meds/All/SocHx/FamHx/ROS:   Past Medical History:  Diagnosis Date  . Thyroid cancer [facility])   Past Surgical History:  Procedure Laterality Date  . THYROIDECTOMY   No family history of bleeding disorders, wound healing problems or difficulty with anesthesia.   Social History   Socioeconomic History  . Marital status: Married  Spouse name: Not on file  . Number of children: Not on file  . Years of education: Not on file  . Highest education level: Not on file  Occupational History  . Not on file  Social Needs  . Financial resource strain: Not on file  . Food insecurity:   Worry: Not on file  Inability: Not on file  . Transportation needs:  Medical: Not on file  Non-medical: Not on file  Tobacco Use  . Smoking status: Never Smoker  . Smokeless tobacco: Never Used  Substance and Sexual Activity  . Alcohol use: Yes  . Drug use: No  . Sexual activity: Not on file  Lifestyle  . Physical activity:  Days per week: Not on file  Minutes per session: Not on file  . Stress: Not on file  Relationships  . Social connections:  Talks on phone: Not on file  Gets together: Not on file  Attends religious service: Not on file  Active member of club or organization: Not on file  Attends meetings of clubs or organizations: Not on file  Relationship status: Not on file  Other Topics Concern  . Not on file  Social History Narrative  . Not on file   Current Outpatient Medications:  . SYNTHROID 175 mcg tablet, , Disp: , Rfl:  . calcium-vitamin D (OYSTER SHELL CALCIUM-VIT D3) 500 mg(1,250mg ) -200 unit per tablet, Take 2 tablets by mouth 2 times daily with meals., Disp: 120 tablet, Rfl: 2 . HYDROcodone-acetaminophen (NORCO) 5-325 mg per tablet, Take 1 tablet by mouth every 6 (six) hours as needed., Disp: 30 tablet, Rfl: 0  A complete ROS was performed with pertinent positives/negatives noted in the HPI. The remainder of the ROS are negative.   Physical Exam:   There were no vitals taken for this visit. He is trim and healthy. Mental status is appropriate. He hears  well in conversational speech. Voice is normally phonatory. He is not coughing or clearing his throat unusually. The head is atraumatic and neck supple. Cranial nerves intact. Ear canals are clear with normal aerated drums. Anterior nose is moist and patent. Oral cavity is clear with teeth in good repair. Oropharynx clear. Brief inspection with the mirror shows the vocal cords to be mobile. Neck with surgical scarring but no palpable masses.  Impression & Plans:   Locally recurrent and nodal metastatic  papillary carcinoma of the left neck. Despite the fact that this is papillary carcinoma, follicular variant, he is manifesting an aggressive local behavior. This will require additional surgery putting the parathyroids and the recurrent nerve at additional risk. I think he is at reasonably high risk of having further recurrences. He and his wife question whether we should do a formal neck dissection and I think basically the approach to the low neck will include a focal neck dissection but not sure there is any reason to do additional levels of neck dissection. Depending on the pathologic findings, he may require external beam radiation. I have asked Dr. Constance Holster to consult on this case and we will together make a decision about attempting the surgery in [facility] or asking for assistance from the otolaryngology department at [facility].

## 2019-01-05 ENCOUNTER — Encounter (HOSPITAL_COMMUNITY)
Admission: RE | Admit: 2019-01-05 | Discharge: 2019-01-05 | Disposition: A | Discharge status: Home / Self Care | Payer: BLUE CROSS/BLUE SHIELD | Source: Ambulatory Visit | Attending: Anesthesiology | Admitting: Anesthesiology

## 2019-01-05 ENCOUNTER — Encounter (HOSPITAL_COMMUNITY): Payer: Self-pay

## 2019-01-05 ENCOUNTER — Other Ambulatory Visit: Payer: Self-pay

## 2019-01-05 ENCOUNTER — Encounter (HOSPITAL_COMMUNITY)
Admission: RE | Admit: 2019-01-05 | Discharge: 2019-01-05 | Disposition: A | Discharge status: Home / Self Care | Payer: BLUE CROSS/BLUE SHIELD | Source: Ambulatory Visit | Attending: Otolaryngology | Admitting: Otolaryngology

## 2019-01-05 DIAGNOSIS — Z85850 Personal history of malignant neoplasm of thyroid: Secondary | ICD-10-CM | POA: Insufficient documentation

## 2019-01-05 DIAGNOSIS — Z01818 Encounter for other preprocedural examination: Secondary | ICD-10-CM | POA: Diagnosis not present

## 2019-01-05 LAB — CBC
HCT: 40.6 % (ref 39.0–52.0)
Hemoglobin: 13.6 g/dL (ref 13.0–17.0)
MCH: 30.6 pg (ref 26.0–34.0)
MCHC: 33.5 g/dL (ref 30.0–36.0)
MCV: 91.2 fL (ref 80.0–100.0)
Platelets: 231 10*3/uL (ref 150–400)
RBC: 4.45 MIL/uL (ref 4.22–5.81)
RDW: 12.8 % (ref 11.5–15.5)
WBC: 3.4 10*3/uL — ABNORMAL LOW (ref 4.0–10.5)
nRBC: 0 % (ref 0.0–0.2)

## 2019-01-05 LAB — BASIC METABOLIC PANEL
Anion gap: 7 (ref 5–15)
BUN: 9 mg/dL (ref 6–20)
CO2: 25 mmol/L (ref 22–32)
Calcium: 9 mg/dL (ref 8.9–10.3)
Chloride: 107 mmol/L (ref 98–111)
Creatinine, Ser: 0.8 mg/dL (ref 0.61–1.24)
GFR calc Af Amer: >60 mL/min (ref >60)
GFR calc non Af Amer: >60 mL/min (ref >60)
Glucose, Bld: 133 mg/dL — ABNORMAL HIGH (ref 70–99)
Potassium: 3.9 mmol/L (ref 3.5–5.1)
Sodium: 139 mmol/L (ref 135–145)

## 2019-01-05 NOTE — Progress Notes (Signed)
PCP - Dr. Myriam Jacobson Cardiologist - n/a  Chest x-ray - today EKG - n/a Stress Test - denies ECHO - denies  Cardiac Cath - denies  Sleep Study - n/a CPAP -  n/a  Fasting Blood Sugar - n/a Checks Blood Sugar _____ times a day  Blood Thinner Instructions: n/a Aspirin Instructions: n/a  Anesthesia review: no  Patient denies shortness of breath, fever, cough and chest pain at PAT appointment   Patient verbalized understanding of instructions that were given to them at the PAT appointment. Patient was also instructed that they will need to review over the PAT instructions again at home before surgery.

## 2019-01-05 NOTE — Progress Notes (Signed)
Patient denies cough, fever (>100.9F), runny nose, sore throat, difficulty or breathing shortness of breath, and travel in the last 14 days. They state that there caretaker has not had any of the above as well.

## 2019-01-05 NOTE — Pre-Procedure Instructions (Signed)
Todd Wood  01/05/2019    Your procedure is scheduled on Wednesday, January 06, 2019 at 8:30 AM.   Report to [facility] Entrance "A" Admitting Office at 6:30 AM.   Call this number if you have problems the morning of surgery: [phone]   Remember:  Do not eat or drink after midnight tonight.  Take these medicines the morning of surgery with A SIP OF WATER: Synthroid    Do not wear jewelry.  Do not wear lotions, powders, cologne or deodorant.  Men may shave face and neck.  Do not bring valuables to the hospital.  [facility] is not responsible for any belongings or valuables.  Contacts, dentures or bridgework may not be worn into surgery.  Leave your suitcase in the car.  After surgery it may be brought to your room.  For patients admitted to the hospital, discharge time will be determined by your treatment team.  [facility] - Preparing for Surgery  Before surgery, you can play an important role.  Because skin is not sterile, your skin needs to be as free of germs as possible.  You can reduce the number of germs on you skin by washing with CHG (chlorahexidine gluconate) soap before surgery.  CHG is an antiseptic cleaner which kills germs and bonds with the skin to continue killing germs even after washing.  Oral Hygiene is also important in reducing the risk of infection.  Remember to brush your teeth with your regular toothpaste the morning of surgery.  Please DO NOT use if you have an allergy to CHG or antibacterial soaps.  If your skin becomes reddened/irritated stop using the CHG and inform your nurse when you arrive at Short Stay.  Do not shave (including legs and underarms) for at least 48 hours prior to the first CHG shower.  You may shave your face.  Please follow these instructions carefully:   1.  Shower with CHG Soap the night before surgery and the morning of Surgery.  2.  If you choose to wash your hair, wash your hair first as usual with your normal  shampoo.  3.  After you shampoo, rinse your hair and body thoroughly to remove the shampoo. 4.  Use CHG as you would any other liquid soap.  You can apply chg directly to the skin and wash gently with a      scrungie or washcloth.           5.  Apply the CHG Soap to your body ONLY FROM THE NECK DOWN.   Do not use on open wounds or open sores. Avoid contact with your eyes, ears, mouth and genitals (private parts).  Wash genitals (private parts) with your normal soap.  6.  Wash thoroughly, paying special attention to the area where your surgery will be performed.  7.  Thoroughly rinse your body with warm water from the neck down.  8.  DO NOT shower/wash with your normal soap after using and rinsing off the CHG Soap.  9.  Pat yourself dry with a clean towel.            10.  Wear clean pajamas.            11.  Place clean sheets on your bed the night of your first shower and do not sleep with pets.  Day of Surgery  Shower as above. Do not apply any lotions/deodorants the morning of surgery.   Please wear clean clothes to the hospital/surgery  center. Remember to brush your teeth with toothpaste.   Please read over the fact sheets that you were given.

## 2019-01-05 NOTE — Anesthesia Preprocedure Evaluation (Addendum)
Anesthesia Evaluation  Patient identified by MRN, date of birth, ID band Patient awake    Reviewed: Allergy & Precautions, NPO status , Patient's Chart, lab work & pertinent test results  History of Anesthesia Complications Negative for: history of anesthetic complications  Airway Mallampati: II  TM Distance: >3 FB Neck ROM: Full    Dental  (+) Dental Advisory Given, Chipped, Poor Dentition, Dental Advidsory Given   Pulmonary neg pulmonary ROS,    breath sounds clear to auscultation       Cardiovascular (-) anginanegative cardio ROS   Rhythm:Regular Rate:Normal     Neuro/Psych negative neurological ROS     GI/Hepatic negative GI ROS, Neg liver ROS,   Endo/Other  Hypothyroidism   Renal/GU negative Renal ROS     Musculoskeletal   Abdominal   Peds  Hematology negative hematology ROS (+)   Anesthesia Other Findings   Reproductive/Obstetrics                            Anesthesia Physical  Anesthesia Plan  ASA: II  Anesthesia Plan: General   Post-op Pain Management:    Induction:   PONV Risk Score and Plan: 3 and Ondansetron and Dexamethasone  Airway Management Planned: Oral ETT  Additional Equipment: None  Intra-op Plan:   Post-operative Plan: Extubation in OR  Informed Consent: I have reviewed the patients History and Physical, chart, labs and discussed the procedure including the risks, benefits and alternatives for the proposed anesthesia with the patient or authorized representative who has indicated his/her understanding and acceptance.     Dental advisory given and Dental Advisory Given  Plan Discussed with: CRNA  Anesthesia Plan Comments:       Anesthesia Quick Evaluation

## 2019-01-06 ENCOUNTER — Encounter (HOSPITAL_COMMUNITY)
Admission: RE | Disposition: A | Discharge status: Home / Self Care | Payer: Self-pay | Source: Home / Self Care | Attending: Otolaryngology

## 2019-01-06 ENCOUNTER — Encounter (HOSPITAL_COMMUNITY): Payer: Self-pay | Admitting: Surgery

## 2019-01-06 ENCOUNTER — Observation Stay (HOSPITAL_COMMUNITY)
Admission: RE | Admit: 2019-01-06 | Discharge: 2019-01-07 | Disposition: A | Discharge status: Home / Self Care | Payer: BLUE CROSS/BLUE SHIELD | Attending: Otolaryngology | Admitting: Otolaryngology

## 2019-01-06 ENCOUNTER — Ambulatory Visit (HOSPITAL_COMMUNITY): Payer: BLUE CROSS/BLUE SHIELD | Admitting: Anesthesiology

## 2019-01-06 DIAGNOSIS — C73 Malignant neoplasm of thyroid gland: Principal | ICD-10-CM | POA: Insufficient documentation

## 2019-01-06 DIAGNOSIS — C770 Secondary and unspecified malignant neoplasm of lymph nodes of head, face and neck: Secondary | ICD-10-CM | POA: Insufficient documentation

## 2019-01-06 DIAGNOSIS — Z85850 Personal history of malignant neoplasm of thyroid: Secondary | ICD-10-CM | POA: Insufficient documentation

## 2019-01-06 DIAGNOSIS — Z79899 Other long term (current) drug therapy: Secondary | ICD-10-CM | POA: Diagnosis not present

## 2019-01-06 DIAGNOSIS — Z79890 Hormone replacement therapy (postmenopausal): Secondary | ICD-10-CM | POA: Insufficient documentation

## 2019-01-06 DIAGNOSIS — C799 Secondary malignant neoplasm of unspecified site: Secondary | ICD-10-CM | POA: Diagnosis not present

## 2019-01-06 HISTORY — DX: Malignant neoplasm of thyroid gland: C73

## 2019-01-06 HISTORY — PX: THYROIDECTOMY: SHX17

## 2019-01-06 LAB — CALCIUM
Calcium: 9 mg/dL (ref 8.9–10.3)
Calcium: 9 mg/dL (ref 8.9–10.3)

## 2019-01-06 SURGERY — THYROIDECTOMY
Anesthesia: General | Site: Neck | Laterality: Left

## 2019-01-06 MED ORDER — SUCCINYLCHOLINE CHLORIDE 20 MG/ML IJ SOLN
INTRAMUSCULAR | Status: DC | PRN
  Administered 2019-01-06: 100 mg via INTRAVENOUS

## 2019-01-06 MED ORDER — MIDAZOLAM HCL 2 MG/2ML IJ SOLN
INTRAMUSCULAR | Status: AC
  Filled 2019-01-06: qty 2

## 2019-01-06 MED ORDER — MIDAZOLAM HCL 5 MG/5ML IJ SOLN
INTRAMUSCULAR | Status: DC | PRN
  Administered 2019-01-06: 2 mg via INTRAVENOUS

## 2019-01-06 MED ORDER — HYDROMORPHONE HCL 1 MG/ML IJ SOLN: 0.2500 mg | INTRAMUSCULAR | Status: DC | PRN

## 2019-01-06 MED ORDER — SODIUM CHLORIDE 0.9 % IV SOLN
INTRAVENOUS | Status: DC | PRN
  Administered 2019-01-06: 50 ug/min via INTRAVENOUS

## 2019-01-06 MED ORDER — MEPERIDINE HCL 50 MG/ML IJ SOLN: 6.2500 mg | INTRAMUSCULAR | Status: DC | PRN

## 2019-01-06 MED ORDER — DEXAMETHASONE SODIUM PHOSPHATE 10 MG/ML IJ SOLN
INTRAMUSCULAR | Status: DC | PRN
  Administered 2019-01-06: 10 mg via INTRAVENOUS

## 2019-01-06 MED ORDER — 0.9 % SODIUM CHLORIDE (POUR BTL) OPTIME
TOPICAL | Status: DC | PRN
  Administered 2019-01-06: 1000 mL

## 2019-01-06 MED ORDER — HYDROCODONE-ACETAMINOPHEN 5-325 MG PO TABS: 1.0000 | ORAL_TABLET | ORAL | Status: DC | PRN

## 2019-01-06 MED ORDER — GLYCOPYRROLATE PF 0.2 MG/ML IJ SOSY
PREFILLED_SYRINGE | INTRAMUSCULAR | Status: DC | PRN
  Administered 2019-01-06: .2 mg via INTRAVENOUS

## 2019-01-06 MED ORDER — LIDOCAINE-EPINEPHRINE 1 %-1:100000 IJ SOLN
INTRAMUSCULAR | Status: DC | PRN
  Administered 2019-01-06: 20 mL

## 2019-01-06 MED ORDER — LIDOCAINE-EPINEPHRINE 1 %-1:100000 IJ SOLN
INTRAMUSCULAR | Status: AC
  Filled 2019-01-06: qty 1

## 2019-01-06 MED ORDER — PROMETHAZINE HCL 25 MG RE SUPP
25.0000 mg | Freq: Four times a day (QID) | RECTAL | Status: DC | PRN
  Filled 2019-01-06: qty 1

## 2019-01-06 MED ORDER — DEXTROSE-NACL 5-0.9 % IV SOLN
INTRAVENOUS | Status: DC
  Administered 2019-01-06: 13:00:00 via INTRAVENOUS

## 2019-01-06 MED ORDER — PROPOFOL 10 MG/ML IV BOLUS
INTRAVENOUS | Status: DC | PRN
  Administered 2019-01-06: 150 mg via INTRAVENOUS

## 2019-01-06 MED ORDER — ONDANSETRON HCL 4 MG/2ML IJ SOLN
INTRAMUSCULAR | Status: DC | PRN
  Administered 2019-01-06: 4 mg via INTRAVENOUS

## 2019-01-06 MED ORDER — LEVOTHYROXINE SODIUM 75 MCG PO TABS
175.0000 ug | ORAL_TABLET | Freq: Every day | ORAL | Status: DC
  Administered 2019-01-07: 175 ug via ORAL
  Filled 2019-01-06: qty 1

## 2019-01-06 MED ORDER — IBUPROFEN 100 MG/5ML PO SUSP
400.0000 mg | Freq: Four times a day (QID) | ORAL | Status: DC | PRN
  Filled 2019-01-06: qty 20

## 2019-01-06 MED ORDER — LACTATED RINGERS IV SOLN
INTRAVENOUS | Status: DC | PRN
  Administered 2019-01-06 (×2): via INTRAVENOUS

## 2019-01-06 MED ORDER — PROPOFOL 10 MG/ML IV BOLUS
INTRAVENOUS | Status: AC
  Filled 2019-01-06: qty 20

## 2019-01-06 MED ORDER — FENTANYL CITRATE (PF) 100 MCG/2ML IJ SOLN
INTRAMUSCULAR | Status: DC | PRN
  Administered 2019-01-06 (×2): 50 ug via INTRAVENOUS
  Administered 2019-01-06: 100 ug via INTRAVENOUS

## 2019-01-06 MED ORDER — PROMETHAZINE HCL 25 MG PO TABS: 25.0000 mg | ORAL_TABLET | Freq: Four times a day (QID) | ORAL | Status: DC | PRN

## 2019-01-06 MED ORDER — FENTANYL CITRATE (PF) 250 MCG/5ML IJ SOLN
INTRAMUSCULAR | Status: AC
  Filled 2019-01-06: qty 5

## 2019-01-06 MED ORDER — PROMETHAZINE HCL 25 MG/ML IJ SOLN: 6.2500 mg | INTRAMUSCULAR | Status: DC | PRN

## 2019-01-06 MED ORDER — LIDOCAINE 2% (20 MG/ML) 5 ML SYRINGE
INTRAMUSCULAR | Status: DC | PRN
  Administered 2019-01-06: 100 mg via INTRAVENOUS

## 2019-01-06 SURGICAL SUPPLY — 44 items
BLADE SURG 15 STRL LF DISP TIS (BLADE) IMPLANT
BLADE SURG 15 STRL SS (BLADE)
CANISTER SUCT 3000ML PPV (MISCELLANEOUS) ×2 IMPLANT
CLEANER TIP ELECTROSURG 2X2 (MISCELLANEOUS) ×2 IMPLANT
CONT SPEC 4OZ CLIKSEAL STRL BL (MISCELLANEOUS) ×2 IMPLANT
CORD BIPOLAR FORCEPS 12FT (ELECTRODE) ×2 IMPLANT
COVER SURGICAL LIGHT HANDLE (MISCELLANEOUS) ×2 IMPLANT
COVER WAND RF STERILE (DRAPES) ×2 IMPLANT
DERMABOND ADVANCED (GAUZE/BANDAGES/DRESSINGS) ×1
DERMABOND ADVANCED .7 DNX12 (GAUZE/BANDAGES/DRESSINGS) ×1 IMPLANT
DRAIN HEMOVAC 7FR (DRAIN) ×2 IMPLANT
DRAIN SNY 10 ROU (WOUND CARE) IMPLANT
DRAPE HALF SHEET 40X57 (DRAPES) IMPLANT
DRSG TEGADERM 2-3/8X2-3/4 SM (GAUZE/BANDAGES/DRESSINGS) ×4 IMPLANT
ELECT COATED BLADE 2.86 ST (ELECTRODE) ×2 IMPLANT
ELECT PAIRED SUBDERMAL (MISCELLANEOUS) ×2
ELECT REM PT RETURN 9FT ADLT (ELECTROSURGICAL) ×2
ELECTRODE PAIRED SUBDERMAL (MISCELLANEOUS) ×1 IMPLANT
ELECTRODE REM PT RTRN 9FT ADLT (ELECTROSURGICAL) ×1 IMPLANT
EVACUATOR SILICONE 100CC (DRAIN) ×2 IMPLANT
FORCEPS BIPOLAR SPETZLER 8 1.0 (NEUROSURGERY SUPPLIES) ×2 IMPLANT
GAUZE 4X4 16PLY RFD (DISPOSABLE) IMPLANT
GAUZE SPONGE 4X4 16PLY XRAY LF (GAUZE/BANDAGES/DRESSINGS) ×4 IMPLANT
GLOVE BIO SURGEON STRL SZ 6.5 (GLOVE) ×2 IMPLANT
GLOVE ECLIPSE 7.5 STRL STRAW (GLOVE) ×2 IMPLANT
GOWN STRL REUS W/ TWL LRG LVL3 (GOWN DISPOSABLE) ×2 IMPLANT
GOWN STRL REUS W/TWL LRG LVL3 (GOWN DISPOSABLE) ×2
KIT BASIN OR (CUSTOM PROCEDURE TRAY) ×2 IMPLANT
KIT TURNOVER KIT B (KITS) ×2 IMPLANT
NEEDLE HYPO 25GX1X1/2 BEV (NEEDLE) ×2 IMPLANT
NEEDLE PRECISIONGLIDE 27X1.5 (NEEDLE) ×2 IMPLANT
NS IRRIG 1000ML POUR BTL (IV SOLUTION) ×2 IMPLANT
PAD ARMBOARD 7.5X6 YLW CONV (MISCELLANEOUS) ×4 IMPLANT
PENCIL FOOT CONTROL (ELECTRODE) ×2 IMPLANT
PROBE NERVBE PRASS .33 (MISCELLANEOUS) ×2 IMPLANT
SHEARS HARMONIC 9CM CVD (BLADE) ×2 IMPLANT
STAPLER VISISTAT 35W (STAPLE) ×2 IMPLANT
SUT CHROMIC 3 0 PS 2 (SUTURE) ×4 IMPLANT
SUT CHROMIC 4 0 PS 2 18 (SUTURE) IMPLANT
SUT ETHILON 3 0 PS 1 (SUTURE) ×2 IMPLANT
SUT SILK 3 0 REEL (SUTURE) ×2 IMPLANT
SUT SILK 4 0 REEL (SUTURE) ×2 IMPLANT
TOWEL NATURAL 6PK STERILE (DISPOSABLE) ×2 IMPLANT
TRAY ENT MC OR (CUSTOM PROCEDURE TRAY) ×2 IMPLANT

## 2019-01-06 NOTE — Op Note (Signed)
OPERATIVE REPORT  DATE OF SURGERY: 01/06/2019  PATIENT:  Todd Wood,  58 y.o. male  PRE-OPERATIVE DIAGNOSIS:  Recurring Thyroid Cancer  POST-OPERATIVE DIAGNOSIS:  Recurring Thyroid Cancer  PROCEDURE:  Procedure(s): Left Revision THYROIDECTOMY with neck dissection  SURGEON:  Beckie Salts, MD  ASSISTANTS: Jolene Provost PA  ANESTHESIA:   General   EBL: 20 ml  DRAINS: 7 French round JP  LOCAL MEDICATIONS USED:  None  SPECIMEN: 1.  Left level 3 lymph node, frozen section positive for metastatic thyroid cancer.  2.  Left tracheoesophageal groove nodule with surrounding tissue, frozen section negative for cancer.  3.  Residual left tracheoesophageal groove soft tissue for permanent pathology.  COUNTS:  Correct  PROCEDURE DETAILS: The patient was taken to the operating room and placed on the operating table in the supine position. Following induction of general endotracheal using monitored anesthesia tube anesthesia, the neck was prepped and draped in a standard fashion.  The previous scar was outlined with a marking pen with extension up along the left lateral neck.    Electrocautery was used to incise the skin.  Self-retaining thyroid retractor was used throughout the case.  Soft tissue was dissected off the lateral aspect of the sternocleidomastoid muscle and brought medially.  The omohyoid muscle was identified and was divided.  All fibrofatty tissue medial to the muscle and lower level 3 and level 4 all the way down to the clavicle was dissected down to the internal jugular vein.  The 1-1/2 cm metastatic node was identified in the lower level 3.  This was removed completely and sent for frozen section evaluation.  No other nodes were identified.  Fibrofatty tissue was completely dissected out of the lower level 3 and all of level 4.  The carotid and vagus nerve were identified and preserved.    The dissection continued all the way to the thyroid bed.  The laryngeal nerve  monitoring was used throughout the case.  The tracheoesophageal groove was inspected and some fibrofatty tissue inferiorly was dissected.  There were no palpable nodes within that.  That was sent separately for pathology evaluation.  There was dissected all the way down to the subclavian vein.  More medially along the course of the recurrent nerve continue dissection identified what appeared to be the recurrent nerve but it did not stimulate with the stimulator.  A 5 or 6 mm dark ovoid nodule was identified and was dissected out and sent for frozen section analysis.  This was negative for cancer.  No other pathology was identified.    Hemostasis was completed.  The drain was exited through a separate stab incision.  Wound was irrigated.  The wound was closed in layers using 3-0 chromic interrupted on the strap muscles, platysma layer, and a running subcuticular closure.  Dermabond was used on the skin.  The drain was charged.  The patient was awakened extubated and transferred to recovery in stable condition.    PATIENT DISPOSITION:  To PACU, stable

## 2019-01-06 NOTE — Progress Notes (Signed)
Received patient from PACU. Patient alert and oriented x4. Dressing clean, dry, and intact. No complaints of pain at this time. Patient oriented to call bell and bed controls. Will continue to monitor.

## 2019-01-06 NOTE — Transfer of Care (Signed)
Immediate Anesthesia Transfer of Care Note  Patient: Todd Wood  Procedure(s) Performed: Left Revision THYROIDECTOMY with neck dissection (Left Neck)  Patient Location: PACU  Anesthesia Type:General  Level of Consciousness: awake, alert  and oriented  Airway & Oxygen Therapy: Patient Spontanous Breathing and Patient connected to face mask oxygen  Post-op Assessment: Report given to RN, Post -op Vital signs reviewed and stable and Patient moving all extremities X 4  Post vital signs: Reviewed and stable  Last Vitals:  Vitals Value Taken Time  BP 124/78 01/06/2019 11:03 AM  Temp    Pulse 67 01/06/2019 11:04 AM  Resp 20 01/06/2019 11:04 AM  SpO2 100 % 01/06/2019 11:04 AM  Vitals shown include unvalidated device data.  Last Pain:  Vitals:   01/06/19 0704  TempSrc: Oral  PainSc:       Patients Stated Pain Goal: 3 (32/99/24 2683)  Complications: No apparent anesthesia complications

## 2019-01-06 NOTE — Anesthesia Procedure Notes (Signed)
Procedure Name: Intubation Date/Time: 01/06/2019 8:43 AM Performed by: Neldon Newport, CRNA Pre-anesthesia Checklist: Timeout performed, Patient being monitored, Suction available, Emergency Drugs available and Patient identified Patient Re-evaluated:Patient Re-evaluated prior to induction Oxygen Delivery Method: Circle system utilized Preoxygenation: Pre-oxygenation with 100% oxygen Induction Type: IV induction and Rapid sequence Laryngoscope Size: Mac and 4 Grade View: Grade I Tube type: Oral (NIMS monitoring ETT) Tube size: 7.0 mm Number of attempts: 1 Airway Equipment and Method: Stylet Placement Confirmation: breath sounds checked- equal and bilateral,  positive ETCO2 and ETT inserted through vocal cords under direct vision Secured at: 24 cm Tube secured with: Tape Dental Injury: Teeth and Oropharynx as per pre-operative assessment

## 2019-01-06 NOTE — Interval H&P Note (Signed)
History and Physical Interval Note:  01/06/2019 8:21 AM  Todd Wood  has presented today for surgery, with the diagnosis of Recurring Thyroid Cancer.  The various methods of treatment have been discussed with the patient and family. After consideration of risks, benefits and other options for treatment, the patient has consented to  Procedure(s): Left Revision THYROIDECTOMY with neck dissection (Left) as a surgical intervention.  The patient's history has been reviewed, patient examined, no change in status, stable for surgery.  I have reviewed the patient's chart and labs.  Questions were answered to the patient's satisfaction.     Izora Gala

## 2019-01-06 NOTE — Discharge Instructions (Signed)
You may shower and use soap and water. Do not use any creams, oils or ointment. ° °

## 2019-01-07 ENCOUNTER — Encounter (HOSPITAL_COMMUNITY): Payer: Self-pay | Admitting: Otolaryngology

## 2019-01-07 ENCOUNTER — Encounter (HOSPITAL_COMMUNITY): Payer: Self-pay

## 2019-01-07 DIAGNOSIS — C73 Malignant neoplasm of thyroid gland: Secondary | ICD-10-CM | POA: Diagnosis not present

## 2019-01-07 DIAGNOSIS — C770 Secondary and unspecified malignant neoplasm of lymph nodes of head, face and neck: Secondary | ICD-10-CM | POA: Diagnosis not present

## 2019-01-07 DIAGNOSIS — Z79899 Other long term (current) drug therapy: Secondary | ICD-10-CM | POA: Diagnosis not present

## 2019-01-07 DIAGNOSIS — Z85850 Personal history of malignant neoplasm of thyroid: Secondary | ICD-10-CM | POA: Diagnosis not present

## 2019-01-07 DIAGNOSIS — Z79890 Hormone replacement therapy (postmenopausal): Secondary | ICD-10-CM | POA: Diagnosis not present

## 2019-01-07 LAB — CALCIUM: Calcium: 9.1 mg/dL (ref 8.9–10.3)

## 2019-01-07 NOTE — Anesthesia Postprocedure Evaluation (Signed)
Anesthesia Post Note  Patient: Todd Wood  Procedure(s) Performed: Left Revision THYROIDECTOMY with neck dissection (Left Neck)     Patient location during evaluation: PACU Anesthesia Type: General Level of consciousness: sedated and patient cooperative Pain management: pain level controlled Vital Signs Assessment: post-procedure vital signs reviewed and stable Respiratory status: spontaneous breathing Cardiovascular status: stable Anesthetic complications: no    Last Vitals:  Vitals:   01/07/19 0153 01/07/19 0441  BP: 121/68 118/67  Pulse: 67 68  Resp: 16 20  Temp: 36.4 C 36.6 C  SpO2: 96% 97%    Last Pain:  Vitals:   01/07/19 0859  TempSrc:   PainSc: 0-No pain                 Nolon Nations

## 2019-01-07 NOTE — Progress Notes (Signed)
Discharged pt to home, alert, oriented and stable. Instructions given and explained.

## 2019-01-07 NOTE — Progress Notes (Signed)
Patient ID: Todd Wood, male   DOB: 23-Jul-1961, 58 y.o.   MRN: [id number]   Doing great no comlaints.  Voice normal and strong. Neck excellent. JP removed.  Calciums normal.  Stable, discharge home.

## 2019-01-07 NOTE — Discharge Summary (Signed)
Physician Discharge Summary  Patient ID: Todd Wood MRN: [id number] DOB/AGE: 59/28/1962 59 y.o.  Admit date: 01/06/2019 Discharge date: 01/07/2019  Admission Diagnoses:thyroid cancer  Discharge Diagnoses:  Active Problems:   Thyroid cancer [facility])   Discharged Condition: good  Hospital Course: no complications  Consults: none  Significant Diagnostic Studies: none  Treatments: surgery: neck dissection, completion thyroidectomy  Discharge Exam: Blood pressure 118/67, pulse 68, temperature 97.9 F (36.6 C), temperature source Oral, resp. rate 20, height 5\' 11"  (1.803 m), weight 98.4 kg, SpO2 97 %. PHYSICAL EXAM: Normal voice, incision excellent. JP out.  Disposition:    Allergies as of 01/07/2019   No Known Allergies     Medication List    TAKE these medications   Synthroid 175 MCG tablet Generic drug:  levothyroxine Take 175 mcg by mouth daily before breakfast.      Follow-up Information    Izora Gala, MD. Schedule an appointment as soon as possible for a visit in 1 week.   Specialty:  Otolaryngology Contact information: [address] [place] [address] [phone]           Signed: Izora Gala 01/07/2019, 8:47 AM

## 2019-02-26 ENCOUNTER — Telehealth (INDEPENDENT_AMBULATORY_CARE_PROVIDER_SITE_OTHER): Payer: BLUE CROSS/BLUE SHIELD | Admitting: Internal Medicine

## 2019-02-26 ENCOUNTER — Other Ambulatory Visit: Payer: Self-pay

## 2019-02-26 ENCOUNTER — Encounter: Payer: Self-pay | Admitting: Internal Medicine

## 2019-02-26 VITALS — Ht 72.0 in | Wt 220.0 lb

## 2019-02-26 DIAGNOSIS — E782 Mixed hyperlipidemia: Secondary | ICD-10-CM | POA: Diagnosis not present

## 2019-02-26 DIAGNOSIS — I2510 Atherosclerotic heart disease of native coronary artery without angina pectoris: Secondary | ICD-10-CM | POA: Diagnosis not present

## 2019-02-26 DIAGNOSIS — I10 Essential (primary) hypertension: Secondary | ICD-10-CM

## 2019-02-26 NOTE — Progress Notes (Signed)
Virtual Visit via Video Note   This visit type was conducted due to national recommendations for restrictions regarding the COVID-19 Pandemic (e.g. social distancing) in an effort to limit this patient's exposure and mitigate transmission in our community.  Due to his co-morbid illnesses, this patient is at least at moderate risk for complications without adequate follow up.  This format is felt to be most appropriate for this patient at this time.  All issues noted in this document were discussed and addressed.  A limited physical exam was performed with this format.  Please refer to the patient's chart for his consent to telehealth for [facility].   Date:  02/26/2019   ID:  Todd Wood, DOB 1960/12/06, MRN [id number]  Patient Location: Home Provider Location: Home  PCP:  Lawerance Cruel, MD  Cardiologist  New  Electrophysiologist:  None   Evaluation Performed:  New Patient Evaluation  Chief Complaint: Patient referred by Dr. Harrington Challenger for evaluation of coronary calcifications.  History of Present Illness:    Todd Wood is a 58 y.o. male with hx of thyroid cancer   CT of chest in Dec showed coronary calcifications of LM and LAD    Patient has no history of chest pain.  He walks some, does not do rigorous activity.  He denies chest pain, shortness of breath, PND, edema.  He  diet he per his wife's report is mainly vegetarian. He changed to  this to be more healthy.  He does not know his lipids offhand.  The patient does not have symptoms concerning for COVID-19 infection (fever, chills, cough, or new shortness of breath).    Past Medical History:  Diagnosis Date  . Hypothyroidism   . Neoplasm of uncertain behavior of thyroid gland 11/14/2015  . Papillary thyroid carcinoma ([place]) 2017   stage III  . Prostate cancer ([place]) 2007  . Recurrent thyroid cancer ([place]) 10/09/2016  . Thyroid cancer ([place]) 01/06/2019   Past Surgical History:  Procedure Laterality Date  . COLONOSCOPY      x 2  . MASS EXCISION Left 10/09/2016   Procedure: EXCISION RESIDUAL THYROID CANCER;  Surgeon: Jodi Marble, MD;  Location: [place];  Service: ENT;  Laterality: Left;  . PROSTATECTOMY    . THYROIDECTOMY N/A 11/16/2015   Procedure: TOTAL THYROIDECTOMY WITH CENTRAL COMPARTMENT LYMPH NODE DISSECTION;  Surgeon: Armandina Gemma, MD;  Location: [facility];  Service: General;  Laterality: N/A;  . THYROIDECTOMY Left 01/06/2019   Procedure: Left Revision THYROIDECTOMY with neck dissection;  Surgeon: Izora Gala, MD;  Location: [facility];  Service: ENT;  Laterality: Left;     Current Meds  Medication Sig  . SYNTHROID 175 MCG tablet Take 175 mcg by mouth daily before breakfast.   Current Facility-Administered Medications for the 02/26/19 encounter (Telemedicine) with Fay Records, MD  Medication  . 0.9 %  sodium chloride infusion     Allergies:   Patient has no known allergies.   Social History   Tobacco Use  . Smoking status: Never Smoker  . Smokeless tobacco: Never Used  Substance Use Topics  . Alcohol use: Yes    Alcohol/week: 7.0 standard drinks    Types: 7 Cans of beer per week    Comment: social  . Drug use: No     Family Hx: The patient's family history includes Alcohol abuse in his father; Diabetes in his sister; Liver cancer in his mother; Prostate cancer in his father. There is no history of Colon cancer or Colon  polyps.  ROS:   Please see the history of present illness.     All other systems reviewed and are negative.   Prior CV studies:   The following studies were reviewed today:   Labs/Other Tests and Data Reviewed:    EKG:  NOt done as tele visit  Recent Labs: 01/05/2019: BUN 9; Creatinine, Ser 0.80; Hemoglobin 13.6; Platelets 231; Potassium 3.9; Sodium 139   Recent Lipid Panel No results found for: CHOL, TRIG, HDL, CHOLHDL, LDLCALC, LDLDIRECT  Wt Readings from Last 3 Encounters:  02/26/19 220 lb (99.8 kg)  01/06/19 217 lb (98.4 kg)  01/05/19 216 lb 4.8 oz (98.1 kg)      Objective:    Vital Signs:  Ht 6' (1.829 m)   Wt 220 lb (99.8 kg)   BMI 29.84 kg/m    Vitals not taken   BP has been good   ASSESSMENT & PLAN:    1. Coronary artery disease.  The patient has evidence of coronary artery disease on CT scan done in December 2019 he appears to be asymptomatic.  Review of the x-rays with him today show calcifications of left main, LAD, RCA.  I have reviewed the images with him.  I would recommend given particularly the significant amount of calcium that he go ahead and have a CT angiogram of the heart done.  He is currently on a baby aspirin.  He should continue this I would also get his lipids from Dr. Alan Ripper office to review and would recommend a statin.  I will not start this until those are seen.  2.  History of thyroid cancer status post surgery   On synthroid  3   Healthcare maintenance.   we will get lipids  My nurse will be in touch with patient regarding test scheduling.  Follow-up based on test results.  COVID-19 Education: The signs and symptoms of COVID-19 were discussed with the patient and how to seek care for testing (follow up with PCP or arrange E-visit).  The importance of social distancing was discussed today.  Time:   Today, I have spent 25 minutes with the patient with telehealth technology discussing the above problems.     Medication Adjustments/Labs and Tests Ordered: Current medicines are reviewed at length with the patient today.  Concerns regarding medicines are outlined above.   Tests Ordered: No orders of the defined types were placed in this encounter.   Medication Changes: No orders of the defined types were placed in this encounter.   Disposition:  Follow up based on test results  Signed, [name], MD  02/26/2019 8:58 AM    [facility]

## 2019-02-26 NOTE — Patient Instructions (Addendum)
Medication Instructions:  No changes today See instructions for cardiac CT - prescription for one tablet of metoprolol will be sent to your pharmacy for day of CT only  If you need a refill on your cardiac medications before your next appointment, please call your pharmacy.   Lab work: None.  Lipids have been requested from your primary care physician office today  If you have labs (blood work) drawn today and your tests are completely normal, you will receive your results only by: Marland Kitchen MyChart Message (if you have MyChart) OR . A paper copy in the mail If you have any lab test that is abnormal or we need to change your treatment, we will call you to review the results.  Testing/Procedures: We will arrange a coronary CT scan.  Instructions to follow.   . Follow-Up: . Follow up with your physician will depend on test results.   Any Other Special Instructions Will Be Listed Below (If Applicable).

## 2019-03-03 ENCOUNTER — Telehealth: Payer: Self-pay | Admitting: *Deleted

## 2019-03-03 DIAGNOSIS — E782 Mixed hyperlipidemia: Secondary | ICD-10-CM

## 2019-03-03 MED ORDER — ROSUVASTATIN CALCIUM 20 MG PO TABS: 20.0000 mg | ORAL_TABLET | Freq: Every day | ORAL | 3 refills | Status: DC

## 2019-03-03 NOTE — Telephone Encounter (Signed)
Spoke with patient. Aware to start Crestor 20 mg w plan to recheck lipids in 3 months and that goal is <70.  Pt stated he had stopped levothyroxine for about a month around the time of those labs, in anticipation of a thyroid scan. He continues on 175 mcg daily and has labs every 6 months.

## 2019-03-03 NOTE — Telephone Encounter (Signed)
-----   Message from [name] V, MD sent at 02/26/2019 11:00 PM EDT ----- Labs show LDL 151   Needs to be 70 or less Also thyroid function supplement is off  In Feb TSH high  ? Was he taking supplement  He should be on Crestor 20 mg   Goal again is LDL 70  Or less

## 2019-04-16 ENCOUNTER — Telehealth: Payer: Self-pay | Admitting: *Deleted

## 2019-04-16 DIAGNOSIS — I2510 Atherosclerotic heart disease of native coronary artery without angina pectoris: Secondary | ICD-10-CM

## 2019-04-16 DIAGNOSIS — Z01812 Encounter for preprocedural laboratory examination: Secondary | ICD-10-CM

## 2019-04-16 MED ORDER — METOPROLOL TARTRATE 100 MG PO TABS: 100.0000 mg | ORAL_TABLET | Freq: Once | ORAL | 0 refills | Status: DC

## 2019-04-16 NOTE — Telephone Encounter (Signed)
Left message for patient to call back. Sent cardiac CT instructions to him via mychart. Placed order for metoprolol 100 mg x1 for 2 hrs prior to study. Pt needs lab appointment. CT scheduled for 04/27/19.

## 2019-04-21 NOTE — Telephone Encounter (Signed)
Spoke with patient. He will come on 7/6 for lab work He has no questions regarding CT instructions.  Screening done for lab appt:      COVID-19 Pre-Screening Questions:  . In the past 7 to 10 days have you had a cough,  shortness of breath, headache, congestion, fever (100 or greater) body aches, chills, sore throat, or sudden loss of taste or sense of smell?  NO . Have you been around anyone with known Covid 19.  NO . Have you been around anyone who is awaiting Covid 19 test results in the past 7 to 10 days?  NO . Have you been around anyone who has been exposed to Covid 19, or has mentioned symptoms of Covid 19 within the past 7 to 10 days?  NO  If you have any concerns/questions about symptoms patients report during screening (either on the phone or at threshold). Contact the provider seeing the patient or DOD for further guidance.  If neither are available contact a member of the leadership team.

## 2019-04-26 ENCOUNTER — Other Ambulatory Visit: Payer: BC Managed Care – PPO | Admitting: *Deleted

## 2019-04-26 ENCOUNTER — Other Ambulatory Visit: Payer: Self-pay

## 2019-04-26 ENCOUNTER — Telehealth (HOSPITAL_COMMUNITY): Payer: Self-pay | Admitting: *Deleted

## 2019-04-26 DIAGNOSIS — I2510 Atherosclerotic heart disease of native coronary artery without angina pectoris: Secondary | ICD-10-CM | POA: Diagnosis not present

## 2019-04-26 DIAGNOSIS — Z01812 Encounter for preprocedural laboratory examination: Secondary | ICD-10-CM | POA: Diagnosis not present

## 2019-04-26 DIAGNOSIS — E782 Mixed hyperlipidemia: Secondary | ICD-10-CM

## 2019-04-26 LAB — BASIC METABOLIC PANEL
BUN/Creatinine Ratio: 15 (ref 9–20)
BUN: 11 mg/dL (ref 6–24)
CO2: 23 mmol/L (ref 20–29)
Calcium: 9.1 mg/dL (ref 8.7–10.2)
Chloride: 105 mmol/L (ref 96–106)
Creatinine, Ser: 0.72 mg/dL — ABNORMAL LOW (ref 0.76–1.27)
GFR calc Af Amer: 119 mL/min/{1.73_m2} (ref >59)
GFR calc non Af Amer: 103 mL/min/{1.73_m2} (ref >59)
Glucose: 139 mg/dL — ABNORMAL HIGH (ref 65–99)
Potassium: 4.1 mmol/L (ref 3.5–5.2)
Sodium: 140 mmol/L (ref 134–144)

## 2019-04-26 NOTE — Telephone Encounter (Signed)
Left VM regarding CT heart

## 2019-04-27 ENCOUNTER — Ambulatory Visit (HOSPITAL_COMMUNITY)
Admission: RE | Admit: 2019-04-27 | Discharge: 2019-04-27 | Disposition: A | Discharge status: Home / Self Care | Payer: BC Managed Care – PPO | Source: Ambulatory Visit | Attending: Internal Medicine | Admitting: Internal Medicine

## 2019-04-27 DIAGNOSIS — I2510 Atherosclerotic heart disease of native coronary artery without angina pectoris: Secondary | ICD-10-CM

## 2019-04-27 MED ORDER — NITROGLYCERIN 0.4 MG SL SUBL
SUBLINGUAL_TABLET | SUBLINGUAL | Status: AC
  Filled 2019-04-27: qty 1

## 2019-04-27 MED ORDER — NITROGLYCERIN 0.4 MG SL SUBL
0.8000 mg | SUBLINGUAL_TABLET | Freq: Once | SUBLINGUAL | Status: AC
  Administered 2019-04-27: 0.8 mg via SUBLINGUAL

## 2019-04-27 MED ORDER — IOHEXOL 350 MG/ML SOLN
80.0000 mL | Freq: Once | INTRAVENOUS | Status: AC | PRN
  Administered 2019-04-27: 16:00:00 80 mL via INTRAVENOUS

## 2019-04-28 ENCOUNTER — Encounter: Payer: Self-pay | Admitting: *Deleted

## 2019-04-28 ENCOUNTER — Telehealth: Payer: Self-pay | Admitting: *Deleted

## 2019-04-28 DIAGNOSIS — Z01812 Encounter for preprocedural laboratory examination: Secondary | ICD-10-CM

## 2019-04-28 NOTE — Telephone Encounter (Signed)
-----   Message from Fay Records, MD sent at 04/28/2019  3:24 PM EDT ----- Discussed with patient.  Severe coronary artery disease.  Would recomm L heart cath to define further; see options for Rx (PTCA/Stent vs surgery vs medicine) WIll need CBC, PT/INR   Pt agrees to proceed   See in next few days

## 2019-04-28 NOTE — Telephone Encounter (Signed)
Spoke with patient.  He is aware of plan.  Instruction letter sent to him via my chart. Left heart cath on 04/30/19 with Dr. Fletcher Anon 7:30 am, arrive 5:30 am Labs at [facility] 04/29/19 covid screening at [facility] drive up after lab work. Home to isolate until hospital Friday am.  Dr. Harrington Challenger to contact pt Thurs 7/9 to update H&P.

## 2019-04-29 ENCOUNTER — Other Ambulatory Visit (HOSPITAL_COMMUNITY)
Admission: RE | Admit: 2019-04-29 | Discharge: 2019-04-29 | Disposition: A | Discharge status: Home / Self Care | Payer: BC Managed Care – PPO | Source: Ambulatory Visit | Attending: Cardiovascular Disease | Admitting: Cardiovascular Disease

## 2019-04-29 ENCOUNTER — Other Ambulatory Visit: Payer: Self-pay

## 2019-04-29 ENCOUNTER — Telehealth: Payer: Self-pay

## 2019-04-29 ENCOUNTER — Other Ambulatory Visit: Payer: BC Managed Care – PPO | Admitting: *Deleted

## 2019-04-29 DIAGNOSIS — Z01812 Encounter for preprocedural laboratory examination: Secondary | ICD-10-CM

## 2019-04-29 DIAGNOSIS — Z1159 Encounter for screening for other viral diseases: Secondary | ICD-10-CM | POA: Diagnosis not present

## 2019-04-29 NOTE — H&P (Signed)
Cardiology Admission History and Physical:   Patient ID: Barton Want MRN: [id number]; DOB: [birth year]   Admission date: (Not on file)  Primary Care Provider: Lawerance Cruel, MD Primary Cardiologist: [name], MD   Chief Complaint:  Pt to present for L heart catheterization for evaluation of CAD    Patient Profile:   Albaraa Swingle is a 58 y.o. male with hx of thryoid cancer found to have coronary artery disease on CT  History of Present Illness:   Mr. Rufo is a a41 yo who I saw in [place] in May 2020   He was referred by Dr Harrington Challenger because CT of chest in Dec 2019 showed calcifications of the LM and LAD When I saw him in clinic he denies CP   Walked, did not do rigorous exercise.  Denied SOB, PND or edema He was set up for a coronary CT angiogram   He had this done this week   This showed: LAD with 100% mid LAD lesaion   DIstal vessel filled via colaterals LCx was dominant with 70% mid stenosis.   OM1 with severe stenosis   PDA with mild plaquing   RCA nondominant and small FFR showed mid LCx <0.5; OM2 <<0.5; PDA <0.5.     I reviewed the findings with the patient   He continues to deny CP   Takes activities as tolerated   Breathing is stable    Heart Pathway Score:     Past Medical History:  Diagnosis Date  . Hypothyroidism   . Neoplasm of uncertain behavior of thyroid gland 11/14/2015  . Papillary thyroid carcinoma ([place]) 2017   stage III  . Prostate cancer ([place]) 2007  . Recurrent thyroid cancer ([place]) 10/09/2016  . Thyroid cancer ([place]) 01/06/2019    Past Surgical History:  Procedure Laterality Date  . COLONOSCOPY     x 2  . MASS EXCISION Left 10/09/2016   Procedure: EXCISION RESIDUAL THYROID CANCER;  Surgeon: Jodi Marble, MD;  Location: [facility];  Service: ENT;  Laterality: Left;  . PROSTATECTOMY    . THYROIDECTOMY N/A 11/16/2015   Procedure: TOTAL THYROIDECTOMY WITH CENTRAL COMPARTMENT LYMPH NODE DISSECTION;  Surgeon: Armandina Gemma, MD;  [facility];  Service:  General;  Laterality: N/A;  . THYROIDECTOMY Left 01/06/2019   Procedure: Left Revision THYROIDECTOMY with neck dissection;  Surgeon: Izora Gala, MD;  Location: [facility];  Service: ENT;  Laterality: Left;     Medications Prior to Admission: Prior to Admission medications   Medication Sig Start Date End Date Taking? Authorizing Provider  aspirin EC 81 MG tablet Take 81 mg by mouth daily.   Yes [provider]  rosuvastatin (CRESTOR) 20 MG tablet Take 1 tablet (20 mg total) by mouth daily. 03/03/19  Yes Fay Records, MD  SYNTHROID 175 MCG tablet Take 175 mcg by mouth daily before breakfast. 11/01/18  Yes [provider]  metoprolol tartrate (LOPRESSOR) 100 MG tablet Take 1 tablet (100 mg total) by mouth once for 1 dose. Take two hours prior to CT scan Patient not taking: Reported on 04/28/2019 04/16/19 04/28/19  Fay Records, MD     Allergies:   No Known Allergies  Social History:   Social History   Socioeconomic History  . Marital status: Married    Spouse name: Not on file  . Number of children: 4  . Years of education: Not on file  . Highest education level: Not on file  Occupational History  . Not on file  Social Needs  . Financial resource strain: Not on file  . Food insecurity    Worry: Not on file    Inability: Not on file  . Transportation needs    Medical: Not on file    Non-medical: Not on file  Tobacco Use  . Smoking status: Never Smoker  . Smokeless tobacco: Never Used  Substance and Sexual Activity  . Alcohol use: Yes    Alcohol/week: 7.0 standard drinks    Types: 7 Cans of beer per week    Comment: social  . Drug use: No  . Sexual activity: Not on file  Lifestyle  . Physical activity    Days per week: Not on file    Minutes per session: Not on file  . Stress: Not on file  Relationships  . Social Herbalist on phone: Not on file    Gets together: Not on file    Attends religious service: Not on file    Active member of club or  organization: Not on file    Attends meetings of clubs or organizations: Not on file    Relationship status: Not on file  . Intimate partner violence    Fear of current or ex partner: Not on file    Emotionally abused: Not on file    Physically abused: Not on file    Forced sexual activity: Not on file  Other Topics Concern  . Not on file  Social History Narrative  . Not on file    Family History:  The patient's family history includes Alcohol abuse in his father; Diabetes in his sister; Liver cancer in his mother; Prostate cancer in his father. There is no history of Colon cancer or Colon polyps.    ROS:  Please see the history of present illness. All other ROS reviewed and negative.     Physical Exam/Data:  There were no vitals filed for this visit. No intake or output data in the 24 hours ending 04/29/19 1045 Last 3 Weights 02/26/2019 01/06/2019 01/05/2019  Weight (lbs) 220 lb 217 lb 216 lb 4.8 oz  Weight (kg) 99.791 kg 98.431 kg 98.113 kg     Pt contacted by telephone to expedite evaluation/treatment    PE not done     EKG:  The ECG that was not  done  Relevant CV Studies: COronary CT angiogram  COMPARISON:  10/19/2018 diagnostic chest CT.  Chest radiograph 01/05/2019.  FINDINGS:  Vascular: Normal caliber of the aorta and branch vessels. No central pulmonary embolism, on this non-dedicated study.  Mediastinum/Nodes: No imaged thoracic adenopathy.  Lungs/Pleura: No imaged pleural fluid.  Clear imaged lungs.  Upper Abdomen: Hepatic cysts and bile duct hamartomas are unchanged. Normal imaged portions of the spleen, stomach, adrenal glands, left kidney.  Musculoskeletal: Midthoracic spondylosis.  IMPRESSION:  No acute findings in the imaged extracardiac chest.   Electronically Signed   By: Abigail Miyamoto M.D.   On: 04/28/2019 14:46   Addended by Abigail Miyamoto, MD on 04/28/2019 2:49 PM    Study Result  CLINICAL DATA:  58 year old male with coronary  calcifications on chest CT.  EXAM: Cardiac/Coronary CT  TECHNIQUE: The patient was scanned on a [employer].  FINDINGS: A 120 kV prospective scan was triggered in the descending thoracic aorta at 111 HU's. Axial non-contrast 3 mm slices were carried out through the heart. The data set was analyzed on a dedicated work station and scored using the [place]. Gantry rotation speed was  250 msecs and collimation was .6 mm. No beta blockade and 0.8 mg of sl NTG was given. The 3D data set was reconstructed in 5% intervals of the 67-82 % of the R-R cycle. Diastolic phases were analyzed on a dedicated work station using MPR, MIP and VRT modes. The patient received 80 cc of contrast.  Aorta: Normal size. Minimal atherosclerotic plaque and calcifications. No dissection.  Aortic Valve:  Trileaflet.  No calcifications.  Coronary Arteries:  Normal coronary origin.  Left dominance.  Left main is a large lumen short artery that gives rise to LAD and LCX arteries. There is mild calcified plaque in the distal left main wit stenosis 25-49%.  LAD is a large vessel that gives rise to a large septal perforator and one diagonal artery. Proximal LAD has mild diffuse mixed plaque with stenosis 25-49%. Mid LAD immediately after takeoff of D1 is occluded. LAD later reconstitutes and has moderate plaque in the distal portion.  LCX is a large dominant artery that gives rise to two OM branches and PDA. Mid LCX artery has severe mixed plaque wit stenosis > 70%.  OM1 has severe proximal plaque with stenosis > 70%.  OM2, PDA have mild plaque.  RCA is a small lumen non-dominant artery that has mild calcified plaque in the proximal segment with stenosis 25-49%.  Other findings:  Normal pulmonary vein drainage into the left atrium.  Normal let atrial appendage without a thrombus.  Normal size of the pulmonary artery.  IMPRESSION: 1. Coronary calcium score of 203.  This was 49 percentile for age and sex matched control.  2. Normal coronary origin with left dominance.  3. Diffuse CAD. Total occlusion of mid LAD and severe stenosis of the mid portion of the dominant LCX artery. CAD RADS 5. Cardiac catheterization is recommended.  Electronically Signed: By: Ena Dawley On: 04/27/2019 17:06      FFR FINDINGS: FFRct analysis was performed on the original cardiac CT angiogram dataset. Diagrammatic representation of the FFRct analysis is provided in a separate PDF document in PACS. This dictation was created using the PDF document and an interactive 3D model of the results. 3D model is not available in the EMR/PACS. Normal FFR range is >0.80.  1. Left Main: 0.99.  2. LAD: Proximal: 0.97, mid: Occluded. 3. D1: 0.90. 4. LCX: Proximal: 0.96, mid: < 0.5 5. OM1: Occluded. 6. OM2: < 0.5. 7. PDA: < 0.5. 8. RCA: 0.95.  IMPRESSION: 1. CT FFR analysis showed occluded mid LAD, OM1, severe stenosis in the mid LCX artery. A cardiac catheterization is recommended.   Electronically Signed   By: Ena Dawley   On: 04/28/2019 14:38   Laboratory Data:  High Sensitivity Troponin:  No results for input(s): TROPONINIHS in the last 720 hours.    Cardiac EnzymesNo results for input(s): TROPONINI in the last 168 hours. No results for input(s): TROPIPOC in the last 168 hours.  Chemistry Recent Labs  Lab 04/26/19 0810  NA 140  K 4.1  CL 105  CO2 23  GLUCOSE 139*  BUN 11  CREATININE 0.72*  CALCIUM 9.1  GFRNONAA 103  GFRAA 119    No results for input(s): PROT, ALBUMIN, AST, ALT, ALKPHOS, BILITOT in the last 168 hours. HematologyNo results for input(s): WBC, RBC, HGB, HCT, MCV, MCH, MCHC, RDW, PLT in the last 168 hours. BNPNo results for input(s): BNP, PROBNP in the last 168 hours.  DDimer No results for input(s): DDIMER in the last 168 hours.   Radiology/Studies:  No  results found.  Assessment and Plan:   PT is a 58 yo  with no hx of CP   Referred to cardiology because coronary calcifications seen on chest CT    CT coronary angiogram this week shows severe CAD with occlusion of LAD and flow limiting leasins in dominant L Cx I have reviewed findings with patient  I would recomm L heart cath to define anatomy, see if lesions are amenable to revasluarlization.   Patient understands risks and benefits and agrees to proceed Keep on same meds for now  2   HL   WIll need to review lipids   Pt needs aggressive control   For questions or updates, please contact [place] Please consult [email or web address] for contact info under        Signed, [name], MD  04/29/2019 10:45 AM

## 2019-04-29 NOTE — Telephone Encounter (Signed)
Reviewed the following pre-procedure instructions with the patient. The patient verbalized understanding of the instructions and has no questions at this time.  Pt contacted pre-catheterization scheduled at [facility] for: 7:30 am on 04/30/19 Verified arrival time and place: [place] Entrance A at: 5:30 am  Covid-19 test date:04/28/19  No solid food after midnight prior to cath, clear liquids until 5 AM day of procedure. Contrast allergy: No Verified no diabetes medications. None  AM meds can be  taken pre-cath with sip of water including: ASA 81 mg  Confirmed patient has responsible person to drive home post procedure and observe 24 hours after arriving home: Patient has a responsible person to drive them home after the procedure and observe them for 24 hours after arriving home.      COVID-19 Pre-Screening Questions:  . In the past 7 to 10 days have you had a cough,  shortness of breath, headache, congestion, fever (100 or greater) body aches, chills, sore throat, or sudden loss of taste or sense of smell? No . Have you been around anyone with known Covid 19? No . Have you been around anyone who is awaiting Covid 19 test results in the past 7 to 10 days? No . Have you been around anyone who has been exposed to Covid 19, or has mentioned symptoms of Covid 19 within the past 7 to 10 days? No  If you have any concerns/questions about symptoms patients report during screening (either on the phone or at threshold). Contact the provider seeing the patient or DOD for further guidance.  If neither are available contact a member of the leadership team.

## 2019-04-30 ENCOUNTER — Ambulatory Visit (HOSPITAL_COMMUNITY)
Admission: RE | Disposition: A | Discharge status: Home / Self Care | Payer: Self-pay | Source: Home / Self Care | Attending: Cardiovascular Disease

## 2019-04-30 ENCOUNTER — Ambulatory Visit (HOSPITAL_COMMUNITY)
Admission: RE | Admit: 2019-04-30 | Discharge: 2019-04-30 | Disposition: A | Discharge status: Home / Self Care | Payer: BC Managed Care – PPO | Attending: Cardiovascular Disease | Admitting: Cardiovascular Disease

## 2019-04-30 ENCOUNTER — Other Ambulatory Visit: Payer: Self-pay

## 2019-04-30 DIAGNOSIS — R931 Abnormal findings on diagnostic imaging of heart and coronary circulation: Secondary | ICD-10-CM

## 2019-04-30 DIAGNOSIS — I2510 Atherosclerotic heart disease of native coronary artery without angina pectoris: Secondary | ICD-10-CM | POA: Diagnosis not present

## 2019-04-30 DIAGNOSIS — Z79890 Hormone replacement therapy (postmenopausal): Secondary | ICD-10-CM | POA: Insufficient documentation

## 2019-04-30 DIAGNOSIS — Z7982 Long term (current) use of aspirin: Secondary | ICD-10-CM | POA: Diagnosis not present

## 2019-04-30 DIAGNOSIS — E785 Hyperlipidemia, unspecified: Secondary | ICD-10-CM | POA: Diagnosis not present

## 2019-04-30 DIAGNOSIS — E039 Hypothyroidism, unspecified: Secondary | ICD-10-CM | POA: Insufficient documentation

## 2019-04-30 DIAGNOSIS — Z7902 Long term (current) use of antithrombotics/antiplatelets: Secondary | ICD-10-CM | POA: Insufficient documentation

## 2019-04-30 DIAGNOSIS — Z79899 Other long term (current) drug therapy: Secondary | ICD-10-CM | POA: Insufficient documentation

## 2019-04-30 DIAGNOSIS — Z8546 Personal history of malignant neoplasm of prostate: Secondary | ICD-10-CM | POA: Diagnosis not present

## 2019-04-30 DIAGNOSIS — Z955 Presence of coronary angioplasty implant and graft: Secondary | ICD-10-CM

## 2019-04-30 DIAGNOSIS — Z85850 Personal history of malignant neoplasm of thyroid: Secondary | ICD-10-CM | POA: Insufficient documentation

## 2019-04-30 HISTORY — PX: LEFT HEART CATH AND CORONARY ANGIOGRAPHY: CATH118249

## 2019-04-30 HISTORY — PX: CORONARY STENT INTERVENTION: CATH118234

## 2019-04-30 LAB — BASIC METABOLIC PANEL
Anion gap: 8 (ref 5–15)
BUN/Creatinine Ratio: 15 (ref 9–20)
BUN: 12 mg/dL (ref 6–24)
BUN: 15 mg/dL (ref 6–20)
CO2: 25 mmol/L (ref 20–29)
CO2: 24 mmol/L (ref 22–32)
Calcium: 9.3 mg/dL (ref 8.7–10.2)
Calcium: 9 mg/dL (ref 8.9–10.3)
Chloride: 101 mmol/L (ref 96–106)
Chloride: 108 mmol/L (ref 98–111)
Creatinine, Ser: 0.8 mg/dL (ref 0.76–1.27)
Creatinine, Ser: 0.78 mg/dL (ref 0.61–1.24)
GFR calc Af Amer: 114 mL/min/{1.73_m2} (ref >59)
GFR calc Af Amer: >60 mL/min (ref >60)
GFR calc non Af Amer: 98 mL/min/{1.73_m2} (ref >59)
GFR calc non Af Amer: >60 mL/min (ref >60)
Glucose, Bld: 123 mg/dL — ABNORMAL HIGH (ref 70–99)
Glucose: 112 mg/dL — ABNORMAL HIGH (ref 65–99)
Potassium: 4.3 mmol/L (ref 3.5–5.2)
Potassium: 4 mmol/L (ref 3.5–5.1)
Sodium: 139 mmol/L (ref 134–144)
Sodium: 140 mmol/L (ref 135–145)

## 2019-04-30 LAB — CBC
HCT: 40.7 % (ref 39.0–52.0)
Hematocrit: 42.3 % (ref 37.5–51.0)
Hemoglobin: 14.2 g/dL (ref 13.0–17.7)
Hemoglobin: 14.3 g/dL (ref 13.0–17.0)
MCH: 31.2 pg (ref 26.6–33.0)
MCH: 31.4 pg (ref 26.0–34.0)
MCHC: 33.6 g/dL (ref 31.5–35.7)
MCHC: 35.1 g/dL (ref 30.0–36.0)
MCV: 93 fL (ref 79–97)
MCV: 89.5 fL (ref 80.0–100.0)
Platelets: 260 10*3/uL (ref 150–450)
Platelets: 233 10*3/uL (ref 150–400)
RBC: 4.55 x10E6/uL (ref 4.14–5.80)
RBC: 4.55 MIL/uL (ref 4.22–5.81)
RDW: 13.1 % (ref 11.6–15.4)
RDW: 12.2 % (ref 11.5–15.5)
WBC: 3.8 10*3/uL (ref 3.4–10.8)
WBC: 3.6 10*3/uL — ABNORMAL LOW (ref 4.0–10.5)
nRBC: 0 % (ref 0.0–0.2)

## 2019-04-30 LAB — SARS CORONAVIRUS 2 (TAT 6-24 HRS): SARS Coronavirus 2: NEGATIVE

## 2019-04-30 SURGERY — LEFT HEART CATH AND CORONARY ANGIOGRAPHY
Anesthesia: LOCAL

## 2019-04-30 MED ORDER — HEPARIN (PORCINE) IN NACL 1000-0.9 UT/500ML-% IV SOLN
INTRAVENOUS | Status: AC
  Filled 2019-04-30: qty 1000

## 2019-04-30 MED ORDER — ACETAMINOPHEN 325 MG PO TABS: 650.0000 mg | ORAL_TABLET | ORAL | Status: DC | PRN

## 2019-04-30 MED ORDER — LIDOCAINE HCL (PF) 1 % IJ SOLN
INTRAMUSCULAR | Status: AC
  Filled 2019-04-30: qty 30

## 2019-04-30 MED ORDER — SODIUM CHLORIDE 0.9 % IV SOLN: INTRAVENOUS | Status: DC

## 2019-04-30 MED ORDER — CLOPIDOGREL BISULFATE 300 MG PO TABS
ORAL_TABLET | ORAL | Status: AC
  Filled 2019-04-30: qty 2

## 2019-04-30 MED ORDER — HEPARIN SODIUM (PORCINE) 1000 UNIT/ML IJ SOLN
INTRAMUSCULAR | Status: DC | PRN
  Administered 2019-04-30 (×2): 5000 [IU] via INTRAVENOUS
  Administered 2019-04-30: 1500 [IU] via INTRAVENOUS

## 2019-04-30 MED ORDER — HEPARIN (PORCINE) IN NACL 1000-0.9 UT/500ML-% IV SOLN
INTRAVENOUS | Status: DC | PRN
  Administered 2019-04-30 (×2): 500 mL

## 2019-04-30 MED ORDER — CLOPIDOGREL BISULFATE 75 MG PO TABS: 75.0000 mg | ORAL_TABLET | Freq: Every day | ORAL | 3 refills | Status: DC

## 2019-04-30 MED ORDER — MIDAZOLAM HCL 2 MG/2ML IJ SOLN
INTRAMUSCULAR | Status: AC
  Filled 2019-04-30: qty 2

## 2019-04-30 MED ORDER — NITROGLYCERIN 1 MG/10 ML FOR IR/CATH LAB
INTRA_ARTERIAL | Status: AC
  Filled 2019-04-30: qty 10

## 2019-04-30 MED ORDER — SODIUM CHLORIDE 0.9% FLUSH: 3.0000 mL | INTRAVENOUS | Status: DC | PRN

## 2019-04-30 MED ORDER — VERAPAMIL HCL 2.5 MG/ML IV SOLN
INTRAVENOUS | Status: AC
  Filled 2019-04-30: qty 2

## 2019-04-30 MED ORDER — CLOPIDOGREL BISULFATE 300 MG PO TABS
ORAL_TABLET | ORAL | Status: DC | PRN
  Administered 2019-04-30: 600 mg via ORAL

## 2019-04-30 MED ORDER — SODIUM CHLORIDE 0.9 % WEIGHT BASED INFUSION
3.0000 mL/kg/h | INTRAVENOUS | Status: AC
  Administered 2019-04-30: 3 mL/kg/h via INTRAVENOUS

## 2019-04-30 MED ORDER — SODIUM CHLORIDE 0.9 % WEIGHT BASED INFUSION
1.0000 mL/kg/h | INTRAVENOUS | Status: DC
  Administered 2019-04-30: 1 mL/kg/h via INTRAVENOUS

## 2019-04-30 MED ORDER — SODIUM CHLORIDE 0.9% FLUSH: 3.0000 mL | Freq: Two times a day (BID) | INTRAVENOUS | Status: DC

## 2019-04-30 MED ORDER — ONDANSETRON HCL 4 MG/2ML IJ SOLN: 4.0000 mg | Freq: Four times a day (QID) | INTRAMUSCULAR | Status: DC | PRN

## 2019-04-30 MED ORDER — MIDAZOLAM HCL 2 MG/2ML IJ SOLN
INTRAMUSCULAR | Status: DC | PRN
  Administered 2019-04-30: 1 mg via INTRAVENOUS

## 2019-04-30 MED ORDER — CLOPIDOGREL BISULFATE 75 MG PO TABS: 75.0000 mg | ORAL_TABLET | Freq: Every day | ORAL | 6 refills | Status: DC

## 2019-04-30 MED ORDER — LIDOCAINE HCL (PF) 1 % IJ SOLN
INTRAMUSCULAR | Status: DC | PRN
  Administered 2019-04-30: 2 mL via INTRADERMAL

## 2019-04-30 MED ORDER — HEPARIN SODIUM (PORCINE) 1000 UNIT/ML IJ SOLN
INTRAMUSCULAR | Status: AC
  Filled 2019-04-30: qty 1

## 2019-04-30 MED ORDER — FENTANYL CITRATE (PF) 100 MCG/2ML IJ SOLN
INTRAMUSCULAR | Status: AC
  Filled 2019-04-30: qty 2

## 2019-04-30 MED ORDER — SODIUM CHLORIDE 0.9 % IV SOLN: 250.0000 mL | INTRAVENOUS | Status: DC | PRN

## 2019-04-30 MED ORDER — ASPIRIN EC 81 MG PO TBEC: 81.0000 mg | DELAYED_RELEASE_TABLET | Freq: Every day | ORAL | 0 refills | Status: DC

## 2019-04-30 MED ORDER — FENTANYL CITRATE (PF) 100 MCG/2ML IJ SOLN
INTRAMUSCULAR | Status: DC | PRN
  Administered 2019-04-30: 25 ug via INTRAVENOUS

## 2019-04-30 MED ORDER — VERAPAMIL HCL 2.5 MG/ML IV SOLN
INTRAVENOUS | Status: DC | PRN
  Administered 2019-04-30: 10 mL via INTRA_ARTERIAL

## 2019-04-30 MED ORDER — ASPIRIN 81 MG PO CHEW: 81.0000 mg | CHEWABLE_TABLET | ORAL | Status: DC

## 2019-04-30 MED ORDER — HEPARIN (PORCINE) IN NACL 1000-0.9 UT/500ML-% IV SOLN
INTRAVENOUS | Status: AC
  Filled 2019-04-30: qty 500

## 2019-04-30 MED ORDER — IOHEXOL 350 MG/ML SOLN
INTRAVENOUS | Status: DC | PRN
  Administered 2019-04-30: 185 mL via INTRAVENOUS

## 2019-04-30 MED ORDER — NITROGLYCERIN 0.4 MG SL SUBL: 0.4000 mg | SUBLINGUAL_TABLET | SUBLINGUAL | 3 refills | Status: DC | PRN

## 2019-04-30 MED FILL — NITROGLYCERIN 0.4 MG TAB SL: 0.4 | 8 days supply | Qty: 25 | Fill #0

## 2019-04-30 SURGICAL SUPPLY — 20 items
BAG SNAP BAND KOVER 36X36 (MISCELLANEOUS) ×2 IMPLANT
BALLN SAPPHIRE 2.75X15 (BALLOONS) ×2
BALLN SAPPHIRE ~~LOC~~ 3.75X18 (BALLOONS) ×2 IMPLANT
BALLOON SAPPHIRE 2.75X15 (BALLOONS) ×1 IMPLANT
CATH INFINITI 5FR ANG PIGTAIL (CATHETERS) ×2 IMPLANT
CATH INFINITI 5FR JK (CATHETERS) ×2 IMPLANT
CATH LAUNCHER 6FR EBU3.5 (CATHETERS) ×2 IMPLANT
COVER DOME SNAP 22 D (MISCELLANEOUS) ×2 IMPLANT
DEVICE RAD COMP TR BAND LRG (VASCULAR PRODUCTS) ×2 IMPLANT
GLIDESHEATH SLEND SS 6F .021 (SHEATH) ×2 IMPLANT
GUIDEWIRE INQWIRE 1.5J.035X260 (WIRE) ×1 IMPLANT
INQWIRE 1.5J .035X260CM (WIRE) ×2
KIT ENCORE 26 ADVANTAGE (KITS) ×2 IMPLANT
KIT HEART LEFT (KITS) ×2 IMPLANT
PACK CARDIAC CATHETERIZATION (CUSTOM PROCEDURE TRAY) ×2 IMPLANT
STENT RESOLUTE ONYX 3.5X26 (Permanent Stent) ×2 IMPLANT
SYR MEDRAD MARK 7 150ML (SYRINGE) ×2 IMPLANT
TRANSDUCER W/STOPCOCK (MISCELLANEOUS) ×2 IMPLANT
TUBING CIL FLEX 10 FLL-RA (TUBING) ×2 IMPLANT
WIRE RUNTHROUGH .014X180CM (WIRE) ×2 IMPLANT

## 2019-04-30 NOTE — Discharge Summary (Signed)
Discharge Summary    Patient ID: Todd Wood,  MRN: [id number], DOB/AGE: September 07, 1961 58 y.o.  Admit date: 04/30/2019 Discharge date: 04/30/2019  Primary Care Provider: Lawerance Wood Primary Cardiologist: Todd Carnes, MD  Discharge Diagnoses    Active Problems:   Abnormal cardiac CT angiography   Allergies No Known Allergies  Diagnostic Studies/Procedures    Left heart catheterization 04/30/2019:  Mid Cx to Dist Cx lesion is 80% stenosed.  Post intervention, there is a 5% residual stenosis.  A drug-eluting stent was successfully placed using a STENT RESOLUTE ONYX 3.5X26.  Prox LAD lesion is 20% stenosed.  Mid LAD lesion is 100% stenosed.  The left ventricular systolic function is normal.  LV end diastolic pressure is normal.  The left ventricular ejection fraction is 50-55% by visual estimate.   1.  Left dominant system with significant two-vessel coronary artery disease (three-vessel equivalent given left dominance) with chronically occluded mid LAD with collaterals from the diagonal and the left circumflex, severe mid to distal left circumflex stenosis and small nondominant RCA. 2.  Low normal LV systolic function with an EF of 50 to 55% and normal left ventricular end-diastolic pressure. 3.  Successful PCI and drug-eluting stent placement to the left circumflex.  Recommendations: In spite of minimal symptoms, given that his coronary anatomy is equivalent to high risk stress test with the angiographic findings and complex plaque in the left circumflex, I elected to stent the left circumflex. Continue aggressive treatment of risk factors and dual antiplatelet therapy for at least 6 months. The patient is a candidate for same-day discharge. _____________   History of Present Illness     Todd Wood is a 58 yo who was seen in a telemedicine visit by Dr. Harrington Challenger in May 2020 for the evaluation of coronary artery calcifications (LM and LAD) noted on CT chest in Dec  2019. He was without chest pain complaints at that time. He would walk, did not do rigorous exercise.  Denied SOB, PND or edema He was set up for a coronary CT angiogram which showed: LAD with 100% mid LAD lesion with distal vessel filled via collaterals, LCx was dominant with 70% mid stenosis, OM1 with severe stenosis, PDA with mild plaquing, and RCA nondominant and small. FFR showed mid LCx <0.5; OM2 <<0.5; PDA <0.5.     Given these findings, he was recommended for outpatient LHC to further evaluate coronary anatomy.   Hospital Course     Consultants: None   1. CAD: patient presented with abnormal coronary CTA presented for [facility] 04/30/2019. He was found to have significant 2 vessel CAD (3 vessel equivalent given left dominance) with chronically occluded mLAD with collaterals for the diagonal and the LCx, as well as severe mid-distal LCx stenosis managed with PCI/DES. He was recommended for DAPT for at least 6 months and aggressive risk factor modifications. Patient tolerated the procedure without complications and was deemed stable for same day discharge. BBlocker not started due to baseline bradycardia - Continue aspirin  - Rx for plavix sent to [place] [facility]  2. HLD:  - Continue crestor 20mg  daily  3. Hypothyroidism: - Continue levothyroxine  _____________  Discharge Vitals Blood pressure 127/85, pulse (!) 57, resp. rate (!) 26, height 6' (1.829 m), weight 97.5 kg, SpO2 100 %.  Filed Weights   04/30/19 0600  Weight: 97.5 kg    Labs & Radiologic Studies    CBC Recent Labs    04/29/19 1157 04/30/19 4034  WBC 3.8 3.6*  HGB 14.2 14.3  HCT 42.3 40.7  MCV 93 89.5  PLT 260 725   Basic Metabolic Panel Recent Labs    04/29/19 1157 04/30/19 0605  NA 139 140  K 4.3 4.0  CL 101 108  CO2 25 24  GLUCOSE 112* 123*  BUN 12 15  CREATININE 0.80 0.78  CALCIUM 9.3 9.0   Liver Function Tests No results for input(s): AST, ALT, ALKPHOS, BILITOT, PROT, ALBUMIN in  the last 72 hours. No results for input(s): LIPASE, AMYLASE in the last 72 hours. Cardiac Enzymes No results for input(s): CKTOTAL, CKMB, CKMBINDEX, TROPONINI in the last 72 hours. BNP Invalid input(s): POCBNP D-Dimer No results for input(s): DDIMER in the last 72 hours. Hemoglobin A1C No results for input(s): HGBA1C in the last 72 hours. Fasting Lipid Panel No results for input(s): CHOL, HDL, LDLCALC, TRIG, CHOLHDL, LDLDIRECT in the last 72 hours. Thyroid Function Tests No results for input(s): TSH, T4TOTAL, T3FREE, THYROIDAB in the last 72 hours.  Invalid input(s): FREET3 _____________  Ct Coronary Morph W/cta Cor W/score W/ca W/cm &/or Wo/cm  Addendum Date: 04/28/2019   ADDENDUM REPORT: 04/28/2019 14:46 ADDENDUM: OVER-READ INTERPRETATION  CT CHEST The following report is an over-read performed by radiologist Dr. Forest Gleason [facility] on 04/28/2019. This over-read does not include interpretation of cardiac or coronary anatomy or pathology. The cardiac CTA interpretation by the cardiologist is attached. COMPARISON: 10/19/2018 diagnostic chest CT.  Chest radiograph 01/05/2019. FINDINGS: Vascular: Normal caliber of the aorta and branch vessels. No central pulmonary embolism, on this non-dedicated study. Mediastinum/Nodes: No imaged thoracic adenopathy. Lungs/Pleura: No imaged pleural fluid.  Clear imaged lungs. Upper Abdomen: Hepatic cysts and bile duct hamartomas are unchanged. Normal imaged portions of the spleen, stomach, adrenal glands, left kidney. Musculoskeletal: Midthoracic spondylosis. IMPRESSION: No acute findings in the imaged extracardiac chest. Electronically Signed   By: Abigail Miyamoto M.D.   On: 04/28/2019 14:46   Result Date: 04/28/2019 CLINICAL DATA:  58 year old male with coronary calcifications on chest CT. EXAM: Cardiac/Coronary CT TECHNIQUE: The patient was scanned on a [employer]. FINDINGS: A 120 kV prospective scan was triggered in the descending  thoracic aorta at 111 HU's. Axial non-contrast 3 mm slices were carried out through the heart. The data set was analyzed on a dedicated work station and scored using the [place]. Gantry rotation speed was 250 msecs and collimation was .6 mm. No beta blockade and 0.8 mg of sl NTG was given. The 3D data set was reconstructed in 5% intervals of the 67-82 % of the R-R cycle. Diastolic phases were analyzed on a dedicated work station using MPR, MIP and VRT modes. The patient received 80 cc of contrast. Aorta: Normal size. Minimal atherosclerotic plaque and calcifications. No dissection. Aortic Valve:  Trileaflet.  No calcifications. Coronary Arteries:  Normal coronary origin.  Left dominance. Left main is a large lumen short artery that gives rise to LAD and LCX arteries. There is mild calcified plaque in the distal left main wit stenosis 25-49%. LAD is a large vessel that gives rise to a large septal perforator and one diagonal artery. Proximal LAD has mild diffuse mixed plaque with stenosis 25-49%. Mid LAD immediately after takeoff of D1 is occluded. LAD later reconstitutes and has moderate plaque in the distal portion. LCX is a large dominant artery that gives rise to two OM branches and PDA. Mid LCX artery has severe mixed plaque wit stenosis > 70%. OM1 has severe proximal plaque with  stenosis > 70%. OM2, PDA have mild plaque. RCA is a small lumen non-dominant artery that has mild calcified plaque in the proximal segment with stenosis 25-49%. Other findings: Normal pulmonary vein drainage into the left atrium. Normal let atrial appendage without a thrombus. Normal size of the pulmonary artery. IMPRESSION: 1. Coronary calcium score of 203. This was 67 percentile for age and sex matched control. 2. Normal coronary origin with left dominance. 3. Diffuse CAD. Total occlusion of mid LAD and severe stenosis of the mid portion of the dominant LCX artery. CAD RADS 5. Cardiac catheterization is recommended.  Electronically Signed: By: Ena Dawley On: 04/27/2019 17:06   Ct Coronary Fractional Flow Reserve Data Prep  Result Date: 04/28/2019 EXAM: CT FFR ANALYSIS CLINICAL DATA:  58 year old male with abnormal coronary CTA. FINDINGS: FFRct analysis was performed on the original cardiac CT angiogram dataset. Diagrammatic representation of the FFRct analysis is provided in a separate PDF document in PACS. This dictation was created using the PDF document and an interactive 3D model of the results. 3D model is not available in the EMR/PACS. Normal FFR range is >0.80. 1. Left Main: 0.99. 2. LAD: Proximal: 0.97, mid: Occluded. 3. D1: 0.90. 4. LCX: Proximal: 0.96, mid: < 0.5 5. OM1: Occluded. 6. OM2: < 0.5. 7. PDA: < 0.5. 8. RCA: 0.95. IMPRESSION: 1. CT FFR analysis showed occluded mid LAD, OM1, severe stenosis in the mid LCX artery. A cardiac catheterization is recommended. Electronically Signed   By: Ena Dawley   On: 04/28/2019 14:38   Ct Coronary Fractional Flow Reserve Fluid Analysis  Result Date: 04/28/2019 EXAM: CT FFR ANALYSIS CLINICAL DATA:  58 year old male with abnormal coronary CTA. FINDINGS: FFRct analysis was performed on the original cardiac CT angiogram dataset. Diagrammatic representation of the FFRct analysis is provided in a separate PDF document in PACS. This dictation was created using the PDF document and an interactive 3D model of the results. 3D model is not available in the EMR/PACS. Normal FFR range is >0.80. 1. Left Main: 0.99. 2. LAD: Proximal: 0.97, mid: Occluded. 3. D1: 0.90. 4. LCX: Proximal: 0.96, mid: < 0.5 5. OM1: Occluded. 6. OM2: < 0.5. 7. PDA: < 0.5. 8. RCA: 0.95. IMPRESSION: 1. CT FFR analysis showed occluded mid LAD, OM1, severe stenosis in the mid LCX artery. A cardiac catheterization is recommended. Electronically Signed   By: Ena Dawley   On: 04/28/2019 14:38   Disposition   Patient was seen and examined by Dr. Fletcher Anon who deemed patient as stable for discharge.  Follow-up has been arranged. Discharge medications as listed below.   Follow-up Plans & Appointments     Discharge Instructions    Amb Referral to Cardiac Rehabilitation   Complete by: As directed    Diagnosis: Coronary Stents   After initial evaluation and assessments completed: Virtual Based Care may be provided alone or in conjunction with Phase 2 Cardiac Rehab based on patient barriers.: Yes      Discharge Medications   Allergies as of 04/30/2019   No Known Allergies     Medication List    STOP taking these medications   metoprolol tartrate 100 MG tablet Commonly known as: LOPRESSOR     TAKE these medications   aspirin EC 81 MG tablet Take 81 mg by mouth daily. What changed: Another medication with the same name was added. Make sure you understand how and when to take each.   aspirin EC 81 MG tablet Take 1 tablet (81 mg total) by mouth  daily. What changed: You were already taking a medication with the same name, and this prescription was added. Make sure you understand how and when to take each.   clopidogrel 75 MG tablet Commonly known as: Plavix Take 1 tablet (75 mg total) by mouth daily.   rosuvastatin 20 MG tablet Commonly known as: CRESTOR Take 1 tablet (20 mg total) by mouth daily.   Synthroid 175 MCG tablet Generic drug: levothyroxine Take 175 mcg by mouth daily before breakfast.        Aspirin prescribed at discharge?  Yes High Intensity Statin Prescribed? (Lipitor 40-80mg  or Crestor 20-40mg ): Yes Beta Blocker Prescribed? No: Baseline bradycardia For EF <40%, was ACEI/ARB Prescribed? No: EF >40% ADP Receptor Inhibitor Prescribed? (i.e. Plavix etc.-Includes Medically Managed Patients): Yes For EF <40%, Aldosterone Inhibitor Prescribed? No: EF >40% Was EF assessed during THIS hospitalization? Yes Was Cardiac Rehab II ordered? (Included Medically managed Patients): Yes   Outstanding Labs/Studies   None  Duration of Discharge Encounter    Greater than 30 minutes including physician time.  Signed, Abigail Butts PA-C 04/30/2019, 11:21 AM

## 2019-04-30 NOTE — Progress Notes (Signed)
[id number] Education completed with pt who voiced understanding. Stressed importance of plavix with stent. Reviewed NTG use, ex ed, heart healthy food choices, CRP 2. Pt stated wife is vegan so they eat healthy. Referred to [place] CRP 2. Interested in virtual APP. Pt is interested in participating in Virtual Cardiac Rehab. Pt advised that Virtual Cardiac Rehab is provided at no cost to the patient.  Checklist:  1. Pt has smart device  ie smartphone and/or ipad for downloading an app  Yes 2. Reliable internet/wifi service    Yes 3. Understands how to use their smartphone and navigate within an app.  Yes   Reviewed with pt the scheduling process for virtual cardiac rehab.  Pt verbalized understanding.

## 2019-04-30 NOTE — Interval H&P Note (Signed)
History and Physical Interval Note:  04/30/2019 7:43 AM  Todd Wood  has presented today for surgery, with the diagnosis of abnormal ct.  The various methods of treatment have been discussed with the patient and family. After consideration of risks, benefits and other options for treatment, the patient has consented to  Procedure(s): LEFT HEART CATH AND CORONARY ANGIOGRAPHY (N/A) as a surgical intervention.  The patient's history has been reviewed, patient examined, no change in status, stable for surgery.  I have reviewed the patient's chart and labs.  Questions were answered to the patient's satisfaction.     Kathlyn [place]

## 2019-04-30 NOTE — Discharge Instructions (Signed)
Radial Site Care ° °This sheet gives you information about how to care for yourself after your procedure. Your health care provider may also give you more specific instructions. If you have problems or questions, contact your health care provider. °What can I expect after the procedure? °After the procedure, it is common to have: °· Bruising and tenderness at the catheter insertion area. °Follow these instructions at home: °Medicines °· Take over-the-counter and prescription medicines only as told by your health care provider. °Insertion site care °· Follow instructions from your health care provider about how to take care of your insertion site. Make sure you: °? Wash your hands with soap and water before you change your bandage (dressing). If soap and water are not available, use hand sanitizer. °? Change your dressing as told by your health care provider. °? Leave stitches (sutures), skin glue, or adhesive strips in place. These skin closures may need to stay in place for 2 weeks or longer. If adhesive strip edges start to loosen and curl up, you may trim the loose edges. Do not remove adhesive strips completely unless your health care provider tells you to do that. °· Check your insertion site every day for signs of infection. Check for: °? Redness, swelling, or pain. °? Fluid or blood. °? Pus or a bad smell. °? Warmth. °· Do not take baths, swim, or use a hot tub until your health care provider approves. °· You may shower 24-48 hours after the procedure, or as directed by your health care provider. °? Remove the dressing and gently wash the site with plain soap and water. °? Pat the area dry with a clean towel. °? Do not rub the site. That could cause bleeding. °· Do not apply powder or lotion to the site. °Activity ° °· For 24 hours after the procedure, or as directed by your health care provider: °? Do not flex or bend the affected arm. °? Do not push or pull heavy objects with the affected arm. °? Do not  drive yourself home from the hospital or clinic. You may drive 24 hours after the procedure unless your health care provider tells you not to. °? Do not operate machinery or power tools. °· Do not lift anything that is heavier than 10 lb (4.5 kg), or the limit that you are told, until your health care provider says that it is safe. °· Ask your health care provider when it is okay to: °? Return to work or school. °? Resume usual physical activities or sports. °? Resume sexual activity. °General instructions °· If the catheter site starts to bleed, raise your arm and put firm pressure on the site. If the bleeding does not stop, get help right away. This is a medical emergency. °· If you went home on the same day as your procedure, a responsible adult should be with you for the first 24 hours after you arrive home. °· Keep all follow-up visits as told by your health care provider. This is important. °Contact a health care provider if: °· You have a fever. °· You have redness, swelling, or yellow drainage around your insertion site. °Get help right away if: °· You have unusual pain at the radial site. °· The catheter insertion area swells very fast. °· The insertion area is bleeding, and the bleeding does not stop when you hold steady pressure on the area. °· Your arm or hand becomes pale, cool, tingly, or numb. °These symptoms may represent a serious problem   that is an emergency. Do not wait to see if the symptoms will go away. Get medical help right away. Call your local emergency services (911 in the [place] Do not drive yourself to the hospital. °Summary °· After the procedure, it is common to have bruising and tenderness at the site. °· Follow instructions from your health care provider about how to take care of your radial site wound. Check the wound every day for signs of infection. °· Do not lift anything that is heavier than 10 lb (4.5 kg), or the limit that you are told, until your health care provider says  that it is safe. °This information is not intended to replace advice given to you by your health care provider. Make sure you discuss any questions you have with your health care provider. °Document Released: 11/09/2010 Document Revised: 11/12/2017 Document Reviewed: 11/12/2017 °Elsevier Patient Education © 2020 [employer] ° °

## 2019-05-03 ENCOUNTER — Encounter (HOSPITAL_COMMUNITY): Payer: Self-pay | Admitting: Cardiovascular Disease

## 2019-05-03 DIAGNOSIS — C770 Secondary and unspecified malignant neoplasm of lymph nodes of head, face and neck: Secondary | ICD-10-CM | POA: Diagnosis not present

## 2019-05-03 DIAGNOSIS — C73 Malignant neoplasm of thyroid gland: Secondary | ICD-10-CM | POA: Diagnosis not present

## 2019-05-03 DIAGNOSIS — E890 Postprocedural hypothyroidism: Secondary | ICD-10-CM | POA: Diagnosis not present

## 2019-05-03 DIAGNOSIS — I700 Atherosclerosis of aorta: Secondary | ICD-10-CM | POA: Diagnosis not present

## 2019-05-03 LAB — POCT ACTIVATED CLOTTING TIME
Activated Clotting Time: 252 seconds
Activated Clotting Time: 235 seconds

## 2019-05-03 MED FILL — Heparin Sod (Porcine)-NaCl IV Soln 1000 Unit/500ML-0.9%: INTRAVENOUS | Qty: 500 | Status: AC

## 2019-05-03 MED FILL — Nitroglycerin IV Soln 100 MCG/ML in D5W: INTRA_ARTERIAL | Qty: 10 | Status: AC

## 2019-05-04 ENCOUNTER — Telehealth (HOSPITAL_COMMUNITY): Payer: Self-pay

## 2019-05-04 NOTE — Telephone Encounter (Signed)
Called patient to see if he is interested in the Cardiac Rehab Program. Patient expressed interest. Explained scheduling process and went over insurance, patient verbalized understanding. Will contact patient for scheduling once f/u has been completed.  °

## 2019-05-04 NOTE — Telephone Encounter (Signed)
Pt insurance is active and benefits verified through [place]. Co-pay $0.00, DED $2,500.00/$2,500.00 met, out of pocket $5,000.00/$5,000.00 met, co-insurance 30%. No pre-authorization required. Passport, 05/04/2019 @ 9:02AM, WUJ#[id number]  Will contact patient to see if he is interested in the Cardiac Rehab Program. If interested, patient will need to complete follow up appt. Once completed, patient will be contacted for scheduling upon review by the RN Navigator.

## 2019-05-07 ENCOUNTER — Telehealth: Payer: Self-pay | Admitting: Cardiology

## 2019-05-07 NOTE — Telephone Encounter (Signed)

## 2019-05-09 ENCOUNTER — Encounter: Payer: Self-pay | Admitting: Cardiology

## 2019-05-09 NOTE — Progress Notes (Signed)
Cardiology Office Note:    Date:  05/10/2019   ID:  Todd Wood, DOB 1961/08/30, MRN [id number]  PCP:  Lawerance Cruel, MD  Cardiologist:  [name], MD  Referring MD: Lawerance Cruel, MD   Chief Complaint  Patient presents with  . Hospitalization Follow-up    Post PCI    History of Present Illness:    Todd Wood is a 58 y.o. male with a past medical history significant for thyroid cancer, prostate cancer and hypothyroidism.  He was referred to see [facility] for presence of coronary calcification of the left main and LAD on chest CT.  He was seen by Dr. Harrington Challenger on 02/26/2019 at which time he denied any chest pain or shortness of breath.  He was set up for coronary CTA angiogram which showed 100% mid LAD lesion with distal vessel filled by collaterals, left circumflex was dominant with 70% mid stenosis, OM1 with severe stenosis, PDA with mild plaquing and RCA nondominant and small.  Cardiac catheterization was arranged and done on 04/30/2019 with findings of significant two-vessel CAD (three-vessel equivalent given left dominance) with chronically occluded mid LAD with collaterals for the diagonal and the left circumflex, as well as severe mid-distal LCx stenosis managed with PCI/DES.  He was recommended for dual antiplatelet therapy with Plavix and aspirin for at least 6 months and aggressive risk factor modifications.  Beta-blocker was not started due to baseline bradycardia.  He was started on high intensity statin with Crestor 20 mg daily.  Todd Wood is here today for hospital follow-up after stenting. He did not note any symptoms prior to the stent, but he now sees that he is not as fatigued with exertion as he was prior. No chest pain or shortness of breath. He is tolerating the medications well and trying to follow a heart healthy diet. His right wrist cath site is healing well.   He works with [employer]. He has been walking his dogs, average of 30-45 minutes.   Past Medical History:   Diagnosis Date  . CAD (coronary artery disease)    a. 04/30/2019: Chronically occluded LAD with collaterals, PCI to mid-distal LCX  . Hypothyroidism   . Neoplasm of uncertain behavior of thyroid gland 11/14/2015  . Papillary thyroid carcinoma ([place]) 2017   stage III  . Prostate cancer ([place]) 2007  . Recurrent thyroid cancer ([place]) 10/09/2016  . Thyroid cancer ([place]) 01/06/2019    Past Surgical History:  Procedure Laterality Date  . COLONOSCOPY     x 2  . CORONARY STENT INTERVENTION N/A 04/30/2019   Procedure: CORONARY STENT INTERVENTION;  Surgeon: Wellington Hampshire, MD;  Location: [facility];  Service: Cardiovascular;  Laterality: N/A;  . LEFT HEART CATH AND CORONARY ANGIOGRAPHY N/A 04/30/2019   Procedure: LEFT HEART CATH AND CORONARY ANGIOGRAPHY;  Surgeon: Wellington Hampshire, MD;  Location: [place] [facility];  Service: Cardiovascular;  Laterality: N/A;  . MASS EXCISION Left 10/09/2016   Procedure: EXCISION RESIDUAL THYROID CANCER;  Surgeon: Jodi Marble, MD;  Location: [facility];  Service: ENT;  Laterality: Left;  . PROSTATECTOMY    . THYROIDECTOMY N/A 11/16/2015   Procedure: TOTAL THYROIDECTOMY WITH CENTRAL COMPARTMENT LYMPH NODE DISSECTION;  Surgeon: Armandina Gemma, MD;  Location[facility];  Service: General;  Laterality: N/A;  . THYROIDECTOMY Left 01/06/2019   Procedure: Left Revision THYROIDECTOMY with neck dissection;  Surgeon: Izora Gala, MD;  Location: [facility];  Service: ENT;  Laterality: Left;    Current Medications: Current Meds  Medication Sig  . aspirin EC 81 MG tablet Take 81 mg by mouth daily.  . clopidogrel (PLAVIX) 75 MG tablet Take 1 tablet (75 mg total) by mouth daily.  . nitroGLYCERIN (NITROSTAT) 0.4 MG SL tablet Place 1 tablet (0.4 mg total) under the tongue every 5 (five) minutes as needed for chest pain.  . rosuvastatin (CRESTOR) 20 MG tablet Take 1 tablet (20 mg total) by mouth daily.  Marland Kitchen SYNTHROID 175 MCG tablet Take 175 mcg by mouth daily before breakfast.      Allergies:   Patient has no known allergies.   Social History   Socioeconomic History  . Marital status: Married    Spouse name: Not on file  . Number of children: 4  . Years of education: Not on file  . Highest education level: Not on file  Occupational History  . Not on file  Social Needs  . Financial resource strain: Not on file  . Food insecurity    Worry: Not on file    Inability: Not on file  . Transportation needs    Medical: Not on file    Non-medical: Not on file  Tobacco Use  . Smoking status: Never Smoker  . Smokeless tobacco: Never Used  Substance and Sexual Activity  . Alcohol use: Yes    Alcohol/week: 7.0 standard drinks    Types: 7 Cans of beer per week    Comment: social  . Drug use: No  . Sexual activity: Not on file  Lifestyle  . Physical activity    Days per week: Not on file    Minutes per session: Not on file  . Stress: Not on file  Relationships  . Social Herbalist on phone: Not on file    Gets together: Not on file    Attends religious service: Not on file    Active member of club or organization: Not on file    Attends meetings of clubs or organizations: Not on file    Relationship status: Not on file  Other Topics Concern  . Not on file  Social History Narrative  . Not on file     Family History: The patient's family history includes Alcohol abuse in his father; Diabetes in his sister; Liver cancer in his mother; Prostate cancer in his father. There is no history of Colon cancer or Colon polyps. ROS:   Please see the history of present illness.     All other systems reviewed and are negative.  EKGs/Labs/Other Studies Reviewed:    The following studies were reviewed today:  Left heart catheterization 04/30/2019:  Mid Cx to Dist Cx lesion is 80% stenosed.  Post intervention, there is a 5% residual stenosis.  A drug-eluting stent was successfully placed using a STENT RESOLUTE ONYX 3.5X26.  Prox LAD lesion is 20%  stenosed.  Mid LAD lesion is 100% stenosed.  The left ventricular systolic function is normal.  LV end diastolic pressure is normal.  The left ventricular ejection fraction is 50-55% by visual estimate.  1. Left dominant system with significant two-vessel coronary artery disease (three-vessel equivalent given left dominance) with chronically occluded mid LAD with collaterals from the diagonal and the left circumflex, severe mid to distal left circumflex stenosis and small nondominant RCA. 2. Low normal LV systolic function with an EF of 50 to 55% and normal left ventricular end-diastolic pressure. 3. Successful PCI and drug-eluting stent placement to the left circumflex.  Recommendations: In spite of minimal symptoms, given  that his coronary anatomy is equivalent to high risk stress test with the angiographic findings and complex plaque in the left circumflex, I elected to stent the left circumflex. Continue aggressive treatment of risk factors and dual antiplatelet therapy for at least 6 months. The patient is a candidate for same-day discharge.  LABS per [facility]   EKG:  EKG is not ordered today.    Recent Labs: 04/30/2019: BUN 15; Creatinine, Ser 0.78; Hemoglobin 14.3; Platelets 233; Potassium 4.0; Sodium 140   Recent Lipid Panel No results found for: CHOL, TRIG, HDL, CHOLHDL, VLDL, LDLCALC, LDLDIRECT  Physical Exam:    VS:  BP 130/76   Pulse 66   Ht 6' (1.829 m)   Wt 217 lb (98.4 kg)   SpO2 97%   BMI 29.43 kg/m     Wt Readings from Last 3 Encounters:  05/10/19 217 lb (98.4 kg)  04/30/19 215 lb (97.5 kg)  02/26/19 220 lb (99.8 kg)     Physical Exam  Constitutional: He is oriented to person, place, and time. He appears well-developed and well-nourished. No distress.  HENT:  Head: Normocephalic and atraumatic.  Neck: Normal range of motion. Neck supple. No JVD present.  Cardiovascular: Normal rate, regular rhythm, normal heart sounds and intact distal pulses. Exam  reveals no gallop and no friction rub.  No murmur heard. Pulmonary/Chest: Effort normal and breath sounds normal. No respiratory distress. He has no wheezes. He has no rales.  Abdominal: Soft. Bowel sounds are normal.  Musculoskeletal: Normal range of motion.        General: No edema.  Neurological: He is alert and oriented to person, place, and time.  Skin: Skin is warm and dry.  Psychiatric: He has a normal mood and affect. His behavior is normal. Judgment and thought content normal.  Vitals reviewed.  Right wrist cath site is healed.    ASSESSMENT:    1. Coronary artery disease involving native coronary artery of native heart without angina pectoris   2. Hyperlipidemia, unspecified hyperlipidemia type    PLAN:    In order of problems listed above:  CAD: Recently diagnosed with CAD on coronary CTA with follow-up left heart catheterization that revealed chronically occluded mid LAD with collaterals, severe mid to distal left circumflex stenosis treated with drug-eluting stent.  The patient was started on aspirin and Plavix with plans for uninterrupted treatment for at least 6 months, as well as statin.  He was not started on beta-blocker due to baseline bradycardia. -Cath results reviewed with pt.  -Pt is feeling like he has more energy after stent.  -Continue aggressive risk factor modification.  -Cardiac rehab encouraged.   Hyperlipidemia: Patient was started on Crestor 20 mg daily. Lipid panel in 10/2018: TC 221, triglycerides 143, LDL 151, HDL 41.  Plan for repeat lipid panel with LFTs in 6 weeks- he has appt for 8/14.   Hypothyroidism: Patient on Synthroid per PCP.  History of thyroid cancer treated with irradiated iodine and surgery.  Lifestyle Modifications to Prevent and Treat Heart Disease -Recommend heart healthy/Mediterranean diet, with whole grains, fruits, vegetable, fish, lean meats, nuts, and olive oil.  -Limit salt. -Recommend moderate walking, 3-5 times/week for  30-50 minutes each session. Aim for at least 150 minutes.week. Goal should be pace of 3 miles/hours, or walking 1.5 miles in 30 minutes -Recommend avoidance of tobacco products. Avoid excess alcohol. -Keep blood pressure well controlled, ideally less than 130/80.     Medication Adjustments/Labs and Tests Ordered: Current medicines are reviewed  at length with the patient today.  Concerns regarding medicines are outlined above. Labs and tests ordered and medication changes are outlined in the patient instructions below:  Patient Instructions  Medication Instructions:  Your physician recommends that you continue on your current medications as directed. Please refer to the Current Medication list given to you today.  If you need a refill on your cardiac medications before your next appointment, please call your pharmacy.   Lab work: None  If you have labs (blood work) drawn today and your tests are completely normal, you will receive your results only by: Marland Kitchen MyChart Message (if you have MyChart) OR . A paper copy in the mail If you have any lab test that is abnormal or we need to change your treatment, we will call you to review the results.  Testing/Procedures: None  Follow-Up: At [facility], you and your health needs are our priority.  As part of our continuing mission to provide you with exceptional heart care, we have created designated Provider Care Teams.  These Care Teams include your primary Cardiologist (physician) and Advanced Practice Providers (APPs -  Physician Assistants and Nurse Practitioners) who all work together to provide you with the care you need, when you need it. You will need a follow up appointment in:  6 months.  Please call our office 2 months in advance to schedule this appointment.  You may see [name], MD or one of the following Advanced Practice Providers on your designated Care Team: Richardson Dopp, PA-C [place], [place] . Daune Perch, NP  Any Other  Special Instructions Will Be Listed Below (If Applicable).  Lifestyle Modifications to Prevent and Treat Heart Disease -Recommend heart healthy/Mediterranean diet, with whole grains, fruits, vegetable, fish, lean meats, nuts, and olive oil.  -Limit salt. -Recommend moderate walking, 3-5 times/week for 30-50 minutes each session. Aim for at least 150 minutes.week. Goal should be pace of 3 miles/hours, or walking 1.5 miles in 30 minutes -Recommend avoidance of tobacco products. Avoid excess alcohol. -Keep blood pressure well controlled, ideally less than 130/80.      Signed, Daune Perch, NP  05/10/2019 2:31 PM    [facility]

## 2019-05-09 NOTE — Patient Instructions (Addendum)
Medication Instructions:  Your physician recommends that you continue on your current medications as directed. Please refer to the Current Medication list given to you today.  If you need a refill on your cardiac medications before your next appointment, please call your pharmacy.   Lab work: None  If you have labs (blood work) drawn today and your tests are completely normal, you will receive your results only by: Marland Kitchen MyChart Message (if you have MyChart) OR . A paper copy in the mail If you have any lab test that is abnormal or we need to change your treatment, we will call you to review the results.  Testing/Procedures: None  Follow-Up: At [facility], you and your health needs are our priority.  As part of our continuing mission to provide you with exceptional heart care, we have created designated Provider Care Teams.  These Care Teams include your primary Cardiologist (physician) and Advanced Practice Providers (APPs -  Physician Assistants and Nurse Practitioners) who all work together to provide you with the care you need, when you need it. You will need a follow up appointment in:  6 months.  Please call our office 2 months in advance to schedule this appointment.  You may see [name], MD or one of the following Advanced Practice Providers on your designated Care Team: Richardson Dopp, PA-C [place], [place] . Daune Perch, NP  Any Other Special Instructions Will Be Listed Below (If Applicable).  Lifestyle Modifications to Prevent and Treat Heart Disease -Recommend heart healthy/Mediterranean diet, with whole grains, fruits, vegetable, fish, lean meats, nuts, and olive oil.  -Limit salt. -Recommend moderate walking, 3-5 times/week for 30-50 minutes each session. Aim for at least 150 minutes.week. Goal should be pace of 3 miles/hours, or walking 1.5 miles in 30 minutes -Recommend avoidance of tobacco products. Avoid excess alcohol. -Keep blood pressure well controlled, ideally  less than 130/80.

## 2019-05-10 ENCOUNTER — Ambulatory Visit (INDEPENDENT_AMBULATORY_CARE_PROVIDER_SITE_OTHER): Payer: BC Managed Care – PPO | Admitting: Cardiology

## 2019-05-10 ENCOUNTER — Other Ambulatory Visit: Payer: Self-pay

## 2019-05-10 ENCOUNTER — Encounter: Payer: Self-pay | Admitting: Cardiology

## 2019-05-10 VITALS — BP 130/76 | HR 66 | Ht 72.0 in | Wt 217.0 lb

## 2019-05-10 DIAGNOSIS — E785 Hyperlipidemia, unspecified: Secondary | ICD-10-CM

## 2019-05-10 DIAGNOSIS — I2510 Atherosclerotic heart disease of native coronary artery without angina pectoris: Secondary | ICD-10-CM

## 2019-05-26 ENCOUNTER — Encounter (HOSPITAL_COMMUNITY): Payer: Self-pay

## 2019-06-04 ENCOUNTER — Other Ambulatory Visit: Payer: Self-pay

## 2019-06-04 ENCOUNTER — Other Ambulatory Visit: Payer: BC Managed Care – PPO | Admitting: *Deleted

## 2019-06-04 ENCOUNTER — Other Ambulatory Visit: Payer: Self-pay | Admitting: Internal Medicine

## 2019-06-04 DIAGNOSIS — E782 Mixed hyperlipidemia: Secondary | ICD-10-CM | POA: Diagnosis not present

## 2019-06-04 LAB — LIPID PANEL
Chol/HDL Ratio: 3.1 ratio (ref 0.0–5.0)
Cholesterol, Total: 107 mg/dL (ref 100–199)
HDL: 35 mg/dL — ABNORMAL LOW (ref >39)
LDL Calculated: 55 mg/dL (ref 0–99)
Triglycerides: 83 mg/dL (ref 0–149)
VLDL Cholesterol Cal: 17 mg/dL (ref 5–40)

## 2019-06-20 ENCOUNTER — Other Ambulatory Visit: Payer: Self-pay | Admitting: Cardiovascular Disease

## 2019-06-21 NOTE — Telephone Encounter (Signed)
Please review for refill, thanks ! 

## 2019-09-09 DIAGNOSIS — C73 Malignant neoplasm of thyroid gland: Secondary | ICD-10-CM | POA: Diagnosis not present

## 2019-09-09 DIAGNOSIS — Z8546 Personal history of malignant neoplasm of prostate: Secondary | ICD-10-CM | POA: Diagnosis not present

## 2019-09-15 DIAGNOSIS — C770 Secondary and unspecified malignant neoplasm of lymph nodes of head, face and neck: Secondary | ICD-10-CM | POA: Diagnosis not present

## 2019-09-15 DIAGNOSIS — C73 Malignant neoplasm of thyroid gland: Secondary | ICD-10-CM | POA: Diagnosis not present

## 2019-09-15 DIAGNOSIS — I700 Atherosclerosis of aorta: Secondary | ICD-10-CM | POA: Diagnosis not present

## 2019-09-15 DIAGNOSIS — E890 Postprocedural hypothyroidism: Secondary | ICD-10-CM | POA: Diagnosis not present

## 2019-09-23 DIAGNOSIS — R58 Hemorrhage, not elsewhere classified: Secondary | ICD-10-CM | POA: Diagnosis not present

## 2019-09-23 DIAGNOSIS — L821 Other seborrheic keratosis: Secondary | ICD-10-CM | POA: Diagnosis not present

## 2019-09-23 DIAGNOSIS — B078 Other viral warts: Secondary | ICD-10-CM | POA: Diagnosis not present

## 2019-09-23 DIAGNOSIS — D225 Melanocytic nevi of trunk: Secondary | ICD-10-CM | POA: Diagnosis not present

## 2019-09-23 DIAGNOSIS — L570 Actinic keratosis: Secondary | ICD-10-CM | POA: Diagnosis not present

## 2019-09-23 DIAGNOSIS — L814 Other melanin hyperpigmentation: Secondary | ICD-10-CM | POA: Diagnosis not present

## 2019-10-17 ENCOUNTER — Other Ambulatory Visit: Payer: Self-pay | Admitting: Cardiovascular Disease

## 2019-10-18 NOTE — Telephone Encounter (Signed)
Please review for refill, Thanks !  

## 2019-10-21 DIAGNOSIS — Z03818 Encounter for observation for suspected exposure to other biological agents ruled out: Secondary | ICD-10-CM | POA: Diagnosis not present

## 2019-10-23 DIAGNOSIS — Z20828 Contact with and (suspected) exposure to other viral communicable diseases: Secondary | ICD-10-CM | POA: Diagnosis not present

## 2019-11-10 NOTE — Progress Notes (Signed)
Cardiology Office Note   Date:  11/12/2019   ID:  Todd Wood, DOB 07-21-1961, MRN [id number]  PCP:  Lawerance Cruel, MD  Cardiologist:   [name], MD    F/U of CAD    History of Present Illness: Todd Wood is a 59 y.o. male with a history of coronary calcifications   On CT  Pt had CT in Dec 2019  Showed calcifications of LM and LAD    Pt denied symtpoms    He went on to have a CT coaronary angiogram done   Ca score was 203   There was diffuse CAD   LAD appeared occludied in mid portion  He went on to have cath on 04/30/19   This showed:   100% mid LAD   80% mid LC   He underwent PTCA/DES to LCx    He was started on Crestor  LDL 55 from 155   HDL 35    The pt says right after the procedure he felt more energy   Now he doesn't note a difference  He never had chest pain  He walks dogs  Denes SOB   No CP  No PND    Current Meds  Medication Sig  . aspirin EC 81 MG tablet Take 81 mg by mouth daily.  . clopidogrel (PLAVIX) 75 MG tablet TAKE 1 TABLET BY MOUTH EVERY DAY  . nitroGLYCERIN (NITROSTAT) 0.4 MG SL tablet Place 1 tablet (0.4 mg total) under the tongue every 5 (five) minutes as needed for chest pain.  . rosuvastatin (CRESTOR) 20 MG tablet Take 1 tablet (20 mg total) by mouth daily.  Marland Kitchen SYNTHROID 175 MCG tablet Take 175 mcg by mouth daily before breakfast.     Allergies:   Patient has no known allergies.   Past Medical History:  Diagnosis Date  . CAD (coronary artery disease)    a. 04/30/2019: Chronically occluded LAD with collaterals, PCI to mid-distal LCX  . Hypothyroidism   . Neoplasm of uncertain behavior of thyroid gland 11/14/2015  . Papillary thyroid carcinoma ([place]) 2017   stage III  . Prostate cancer ([place]) 2007  . Recurrent thyroid cancer ([place]) 10/09/2016  . Thyroid cancer ([place]) 01/06/2019    Past Surgical History:  Procedure Laterality Date  . COLONOSCOPY     x 2  . CORONARY STENT INTERVENTION N/A 04/30/2019   Procedure: CORONARY STENT INTERVENTION;   Surgeon: Wellington Hampshire, MD;  Location: [place] [facility];  Service: Cardiovascular;  Laterality: N/A;  . LEFT HEART CATH AND CORONARY ANGIOGRAPHY N/A 04/30/2019   Procedure: LEFT HEART CATH AND CORONARY ANGIOGRAPHY;  Surgeon: Wellington Hampshire, MD;  Location: [place] [facility];  Service: Cardiovascular;  Laterality: N/A;  . MASS EXCISION Left 10/09/2016   Procedure: EXCISION RESIDUAL THYROID CANCER;  Surgeon: Jodi Marble, MD;  Location: [place];  Service: ENT;  Laterality: Left;  . PROSTATECTOMY    . THYROIDECTOMY N/A 11/16/2015   Procedure: TOTAL THYROIDECTOMY WITH CENTRAL COMPARTMENT LYMPH NODE DISSECTION;  Surgeon: Armandina Gemma, MD;  [facility];  Service: General;  Laterality: N/A;  . THYROIDECTOMY Left 01/06/2019   Procedure: Left Revision THYROIDECTOMY with neck dissection;  Surgeon: Izora Gala, MD;  Location: [place];  Service: ENT;  Laterality: Left;     Social History:  The patient  reports that he has never smoked. He has never used smokeless tobacco. He reports current alcohol use of about 7.0 standard drinks of alcohol per week. He reports that  he does not use drugs.   Family History:  The patient's family history includes Alcohol abuse in his father; Diabetes in his sister; Liver cancer in his mother; Prostate cancer in his father.    ROS:  Please see the history of present illness. All other systems are reviewed and  Negative to the above problem except as noted.    PHYSICAL EXAM: VS:  BP 120/74   Pulse 68   Ht 6' (1.829 m)   Wt 218 lb 1.9 oz (98.9 kg)   SpO2 98%   BMI 29.58 kg/m   GEN: Well nourished, well developed, in no acute distress  HEENT: normal  Neck: no JVD, carotid bruits, or masses Cardiac: RRR; no murmurs, rubs, or gallops,no edema  Respiratory:  clear to auscultation bilaterally, normal work of breathing GI: soft, nontender, nondistended, + BS  No hepatomegaly  MS: no deformity Moving all extremities   Skin: warm and dry, no rash Neuro:   Strength and sensation are intact Psych: euthymic mood, full affect   EKG:  EKG is not ordered today.   Lipid Panel    Component Value Date/Time   CHOL 107 06/04/2019 0839   TRIG 83 06/04/2019 0839   HDL 35 (L) 06/04/2019 0839   CHOLHDL 3.1 06/04/2019 0839   LDLCALC 55 06/04/2019 0839      Wt Readings from Last 3 Encounters:  11/12/19 218 lb 1.9 oz (98.9 kg)  05/10/19 217 lb (98.4 kg)  04/30/19 215 lb (97.5 kg)      ASSESSMENT AND PLAN:  1  CAD   Pt with CAD that was incidentally discovered last year  Now with intervention to LCx    LAD occluded   DOing well     I will review with interventional service use of Plavix   For now I would keep on given anatomy     Check CBC  2  HL   Lipds are excellent   Keep on same regimen  3  Thyroid   Will check TSH   On synthroid   Last level was low   4  COVID  Encouraged him to keep eye out for vaccine opportunities     Plan for folllw up in erarly September    Current medicines are reviewed at length with the patient today.  The patient does not have concerns regarding medicines.  Signed, [name], MD  11/12/2019 9:09 AM    [address], [place], [place]  [address] Phone: [phone]; Fax: [phone]

## 2019-11-12 ENCOUNTER — Other Ambulatory Visit: Payer: Self-pay

## 2019-11-12 ENCOUNTER — Encounter: Payer: Self-pay | Admitting: Internal Medicine

## 2019-11-12 ENCOUNTER — Ambulatory Visit (INDEPENDENT_AMBULATORY_CARE_PROVIDER_SITE_OTHER): Payer: BC Managed Care – PPO | Admitting: Internal Medicine

## 2019-11-12 VITALS — BP 120/74 | HR 68 | Ht 72.0 in | Wt 218.1 lb

## 2019-11-12 DIAGNOSIS — E038 Other specified hypothyroidism: Secondary | ICD-10-CM | POA: Diagnosis not present

## 2019-11-12 DIAGNOSIS — I2510 Atherosclerotic heart disease of native coronary artery without angina pectoris: Secondary | ICD-10-CM

## 2019-11-12 LAB — CBC
Hematocrit: 41.6 % (ref 37.5–51.0)
Hemoglobin: 14.5 g/dL (ref 13.0–17.7)
MCH: 30.6 pg (ref 26.6–33.0)
MCHC: 34.9 g/dL (ref 31.5–35.7)
MCV: 88 fL (ref 79–97)
Platelets: 250 10*3/uL (ref 150–450)
RBC: 4.74 x10E6/uL (ref 4.14–5.80)
RDW: 12.7 % (ref 11.6–15.4)
WBC: 3.4 10*3/uL (ref 3.4–10.8)

## 2019-11-12 LAB — TSH: TSH: <0.005 u[IU]/mL — ABNORMAL LOW (ref 0.450–4.500)

## 2019-11-12 NOTE — Patient Instructions (Signed)
Medication Instructions:  No changes *If you need a refill on your cardiac medications before your next appointment, please call your pharmacy*  Lab Work: Today: cbc, tsh If you have labs (blood work) drawn today and your tests are completely normal, you will receive your results only by: Marland Kitchen MyChart Message (if you have MyChart) OR . A paper copy in the mail If you have any lab test that is abnormal or we need to change your treatment, we will call you to review the results.  Testing/Procedures: none  Follow-Up: At [facility], you and your health needs are our priority.  As part of our continuing mission to provide you with exceptional heart care, we have created designated Provider Care Teams.  These Care Teams include your primary Cardiologist (physician) and Advanced Practice Providers (APPs -  Physician Assistants and Nurse Practitioners) who all work together to provide you with the care you need, when you need it.  Your next appointment:   8 month(s)  The format for your next appointment:   Either In Person or Virtual  Provider:   You may see [name], MD or one of the following Advanced Practice Providers on your designated Care Team:    Richardson Dopp, PA-C  [place], [place]  Daune Perch, NP   Other Instructions

## 2019-11-15 NOTE — Telephone Encounter (Signed)
Forwarded lab results to patient's PCP and endocrinologist.

## 2019-12-19 NOTE — Progress Notes (Signed)
Keep on same meds for now

## 2019-12-20 NOTE — Progress Notes (Signed)
Patient has been informed that after reviewing, Dr. Harrington Challenger recommends he remain on Plavix for now.  Pt verbalizes understanding and has no questions.

## 2020-01-07 DIAGNOSIS — C73 Malignant neoplasm of thyroid gland: Secondary | ICD-10-CM | POA: Diagnosis not present

## 2020-02-24 ENCOUNTER — Other Ambulatory Visit: Payer: Self-pay | Admitting: Internal Medicine

## 2020-03-03 DIAGNOSIS — E890 Postprocedural hypothyroidism: Secondary | ICD-10-CM | POA: Diagnosis not present

## 2020-03-03 DIAGNOSIS — C73 Malignant neoplasm of thyroid gland: Secondary | ICD-10-CM | POA: Diagnosis not present

## 2020-03-14 DIAGNOSIS — Z85850 Personal history of malignant neoplasm of thyroid: Secondary | ICD-10-CM | POA: Diagnosis not present

## 2020-03-14 DIAGNOSIS — E890 Postprocedural hypothyroidism: Secondary | ICD-10-CM | POA: Diagnosis not present

## 2020-03-14 DIAGNOSIS — Z8546 Personal history of malignant neoplasm of prostate: Secondary | ICD-10-CM | POA: Diagnosis not present

## 2020-04-10 ENCOUNTER — Other Ambulatory Visit: Payer: Self-pay | Admitting: Ophthalmology

## 2020-04-10 DIAGNOSIS — L989 Disorder of the skin and subcutaneous tissue, unspecified: Secondary | ICD-10-CM | POA: Diagnosis not present

## 2020-04-10 DIAGNOSIS — L980 Pyogenic granuloma: Secondary | ICD-10-CM | POA: Diagnosis not present

## 2020-04-11 ENCOUNTER — Other Ambulatory Visit: Payer: Self-pay | Admitting: Medical

## 2020-04-11 NOTE — Telephone Encounter (Signed)
Upon review of chart, last ov note 11/12/19 from Dr. Harrington Challenger:  ASSESSMENT AND PLAN:  1  CAD   Pt with CAD that was incidentally discovered last year  Now with intervention to LCx    LAD occluded   DOing well     I will review with interventional service use of Plavix   For now I would keep on given anatomy     Check CBC    Will route to Dr. Harrington Challenger for recommendations.

## 2020-05-05 IMAGING — CT CT CHEST W/ CM
2 of 4 series · 12 of 36 positions shown, 15 images · IV contrast (iopamidol)
Comparison: Chest CT 07/22/2016.

CLINICAL DATA: 49-year-old male with history of papillary thyroid
cancer diagnosed 2-3 years ago status post thyroidectomy and
radioactive iodine treatments.

EXAM:
CT CHEST WITH CONTRAST
TECHNIQUE: Multidetector CT imaging of the chest was performed during
intravenous contrast administration.
CONTRAST:  125mL JN2UMX-[id number] IOPAMIDOL (JN2UMX-5XX) INJECTION 61%

[Series 2: chest 2.00 br40 s3 ax · axial · 0.65mm/px · z∈[-1043,-775]mm · 9 of 160 slices shown, 12 images]
[im 13/160  mediastinal]
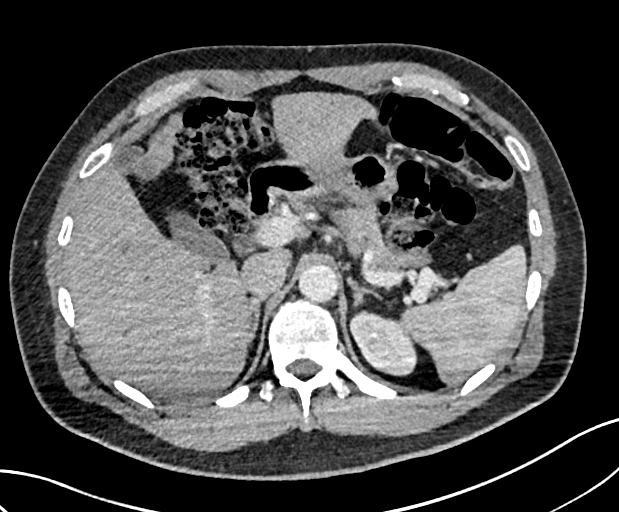
[im 13/160  lung]
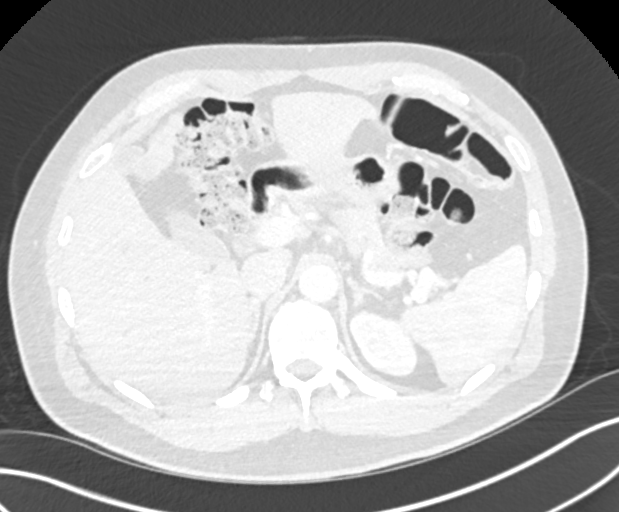
[im 37/160  lung]
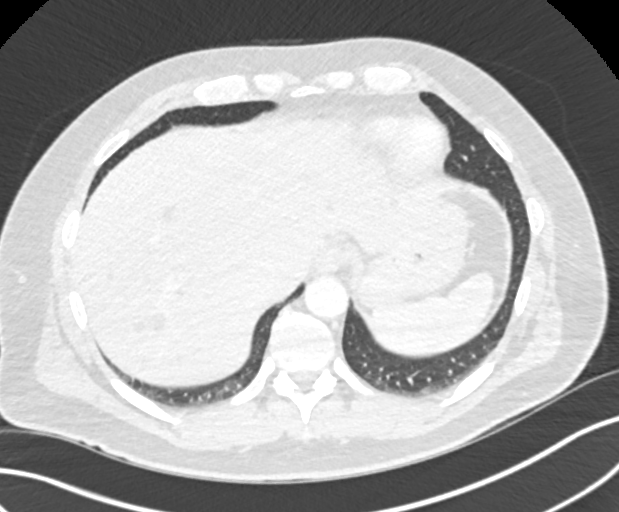
[im 49/160  lung]
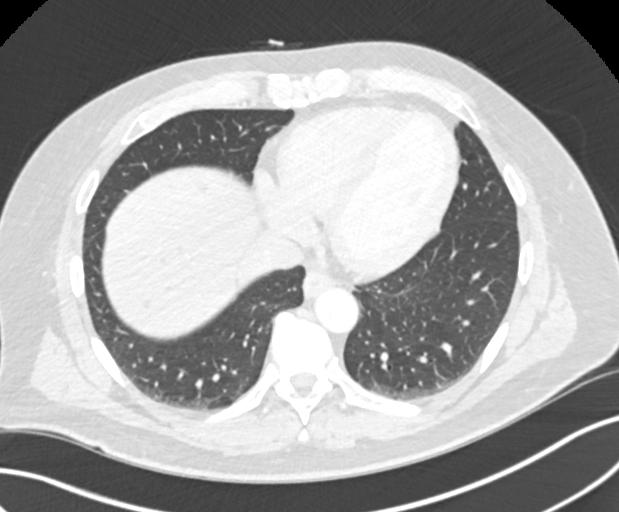
[im 62/160  lung]
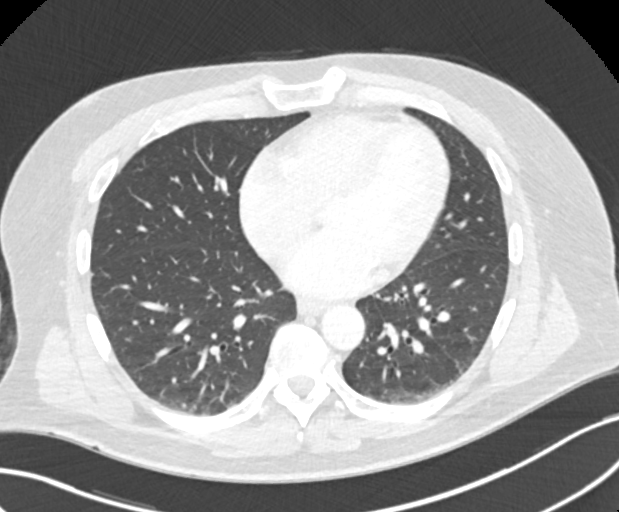
[im 86/160  mediastinal]
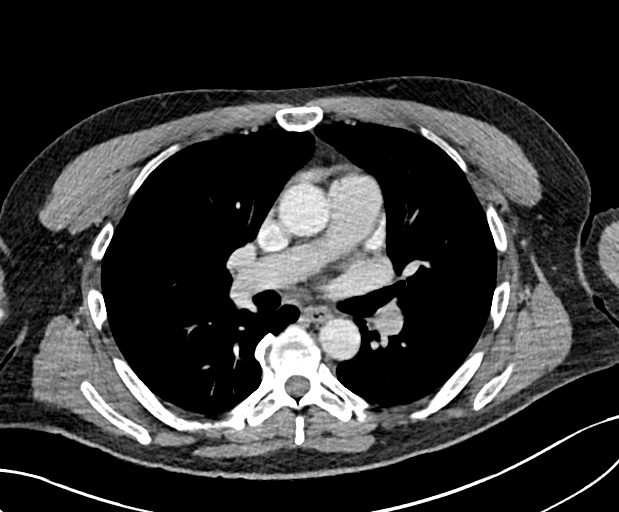
[im 86/160  lung]
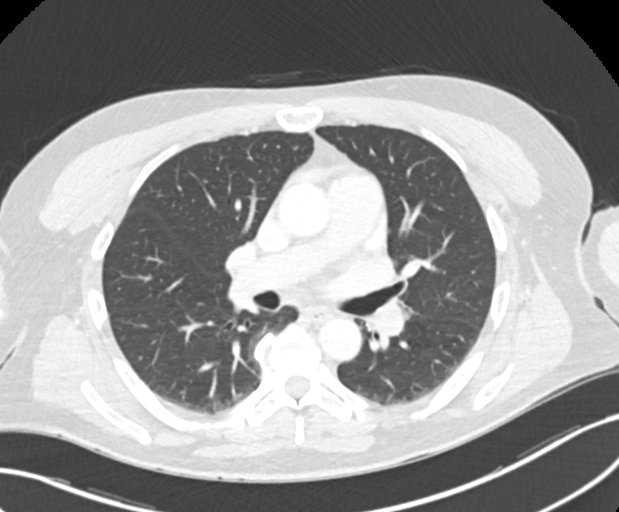
[im 98/160  lung]
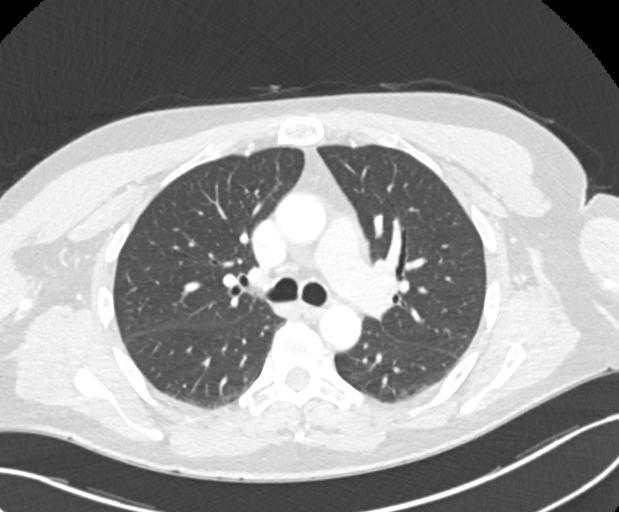
[im 111/160  lung]
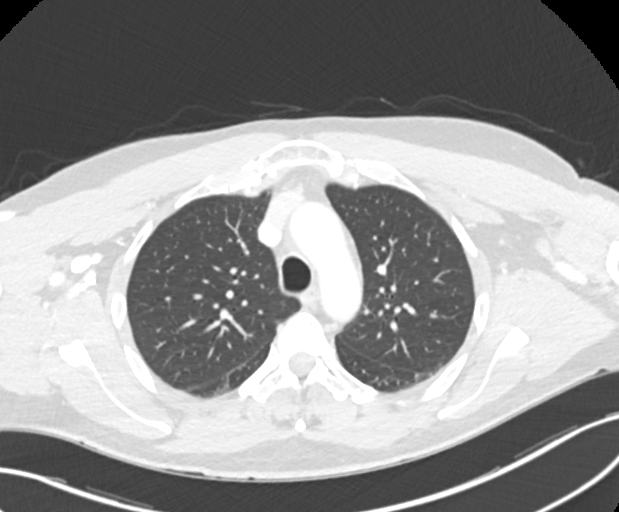
[im 135/160  lung]
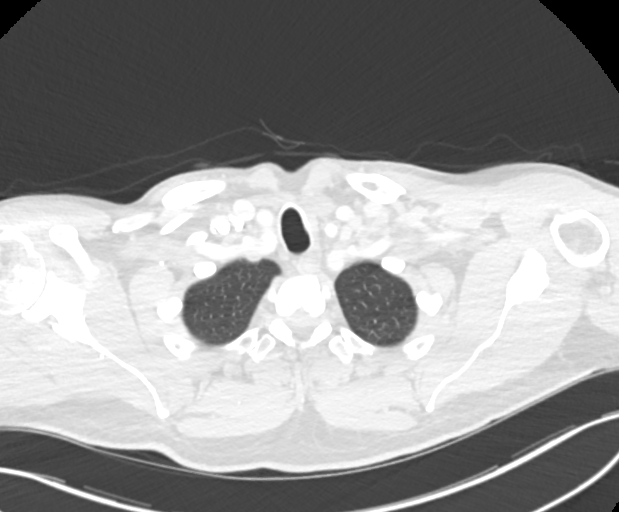
[im 147/160  mediastinal]
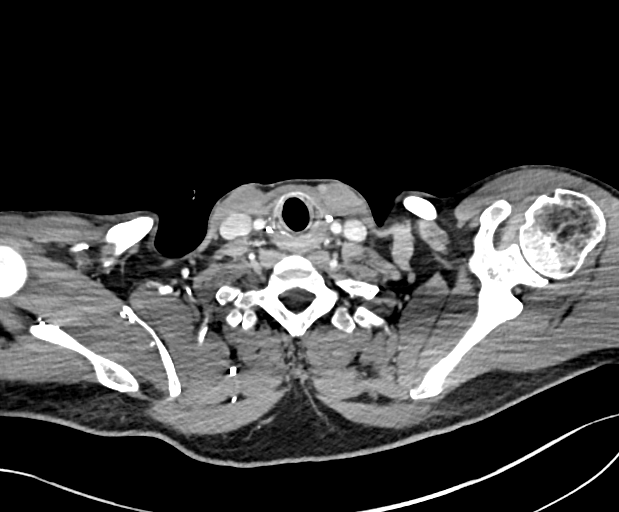
[im 147/160  lung]
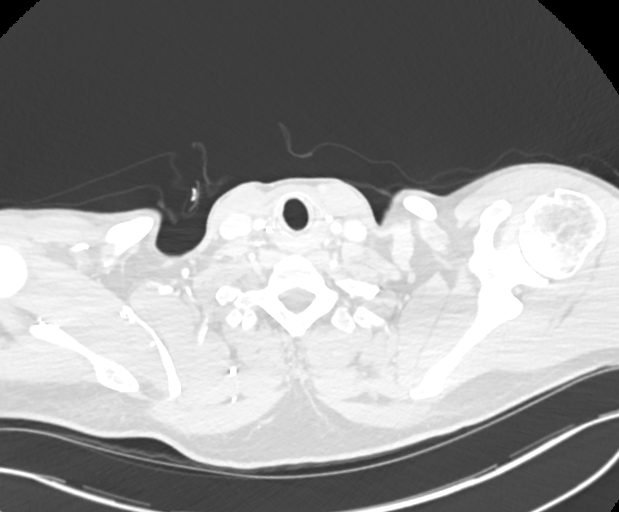

[Series 4: chest 2.00 br40 s3 cor · coronal · 0.63mm/px · 3 of 165 slices shown]
[im 33/165  lung]
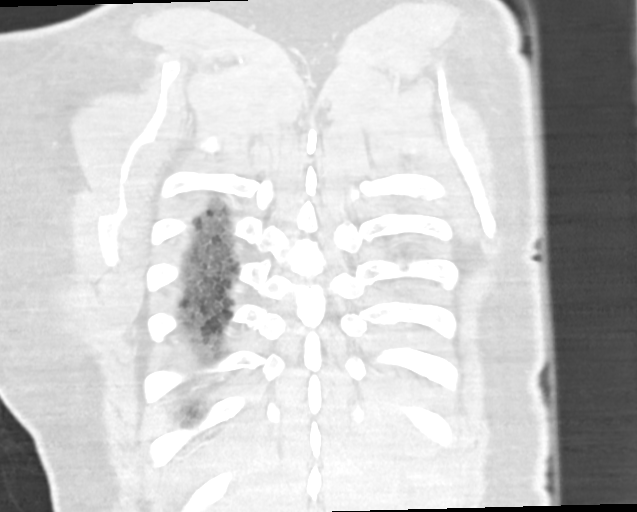
[im 66/165  lung]
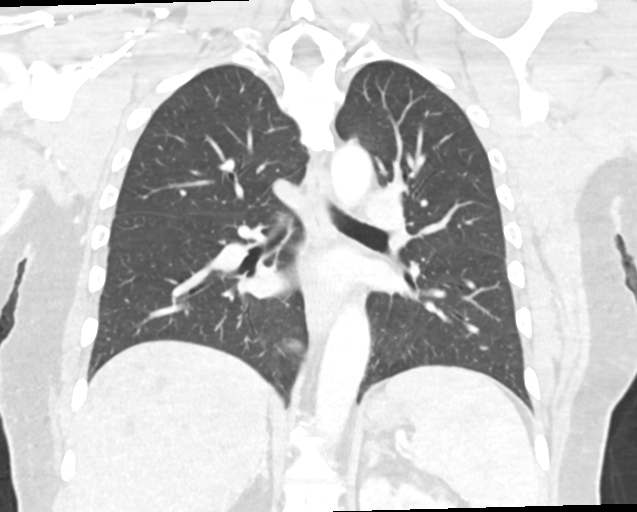
[im 99/165  lung]
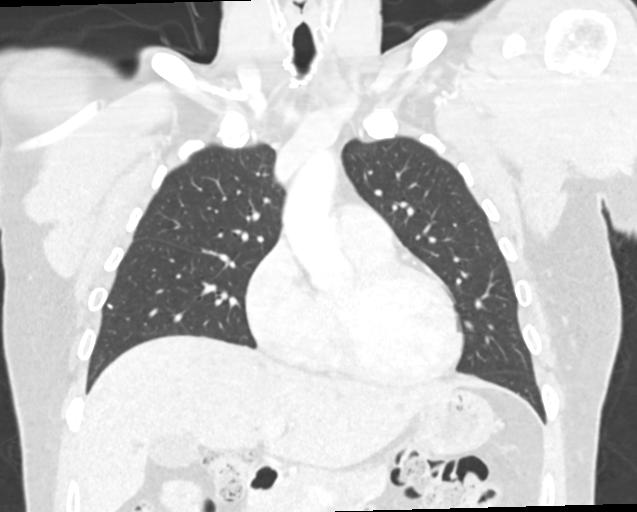

[12 of 36 positions shown; findings below may reference images not displayed]

FINDINGS: Cardiovascular: Heart size is normal. There is no significant
pericardial fluid, thickening or pericardial calcification. There is
aortic atherosclerosis, as well as atherosclerosis of the great
vessels of the mediastinum and the coronary arteries, including
calcified atherosclerotic plaque in the left main, left anterior
descending, left circumflex and right coronary arteries.

Mediastinum/Nodes: No pathologically enlarged mediastinal or hilar
lymph nodes. Mildly enlarged peripherally calcified 12 mm low left
cervical lymph node (axial image 6 of series 2). Status post
thyroidectomy. Esophagus is unremarkable in appearance. No axillary
lymphadenopathy.

Lungs/Pleura: Small calcified granuloma in the lateral segment of
the right middle lobe. No other suspicious appearing pulmonary
nodules or masses are noted. No acute consolidative airspace
disease. No pleural effusions.

Upper Abdomen: Innumerable small low-attenuation lesions scattered
throughout the visualized hepatic parenchyma, similar to the prior
examination from 8150, largest of which are compatible with simple
cysts, measuring up to 2 cm in the right lobe of the liver.

Musculoskeletal: There are no aggressive appearing lytic or blastic
lesions noted in the visualized portions of the skeleton.
IMPRESSION: 1. 12 mm low left cervical lymph node corresponding to biopsy-proven
metastasis.
2. No suspicious appearing pulmonary nodules or masses are noted to
suggest metastatic disease to the lungs.
3. Aortic atherosclerosis, in addition to left main and 3 vessel
coronary artery disease. Please note that although the presence of
coronary artery calcium documents the presence of coronary artery
disease, the severity of this disease and any potential stenosis
cannot be assessed on this non-gated CT examination. Assessment for
potential risk factor modification, dietary therapy or pharmacologic
therapy may be warranted, if clinically indicated.
4. Additional incidental findings, as above.

Aortic Atherosclerosis ([id number]-[id number]).

## 2020-08-10 IMAGING — US US THYROID
1 series · 14 of 23 positions shown · non-contrast
Comparison: Scintigraphy 01/05/2018, ultrasound 09/19/2017

CLINICAL DATA: Papillary thyroid carcinoma post thyroidectomy

EXAM:
THYROID ULTRASOUND
TECHNIQUE: Ultrasound examination of the thyroidectomy bed and adjacent soft
tissues was performed.

[Series 1: us thyroid · 0.07mm/px · 14 of 23 slices shown]
[im 1/23]
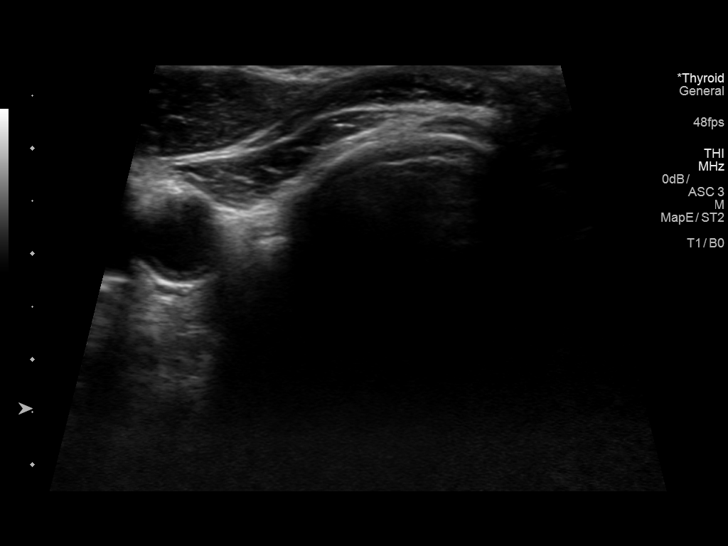
[im 3/23]
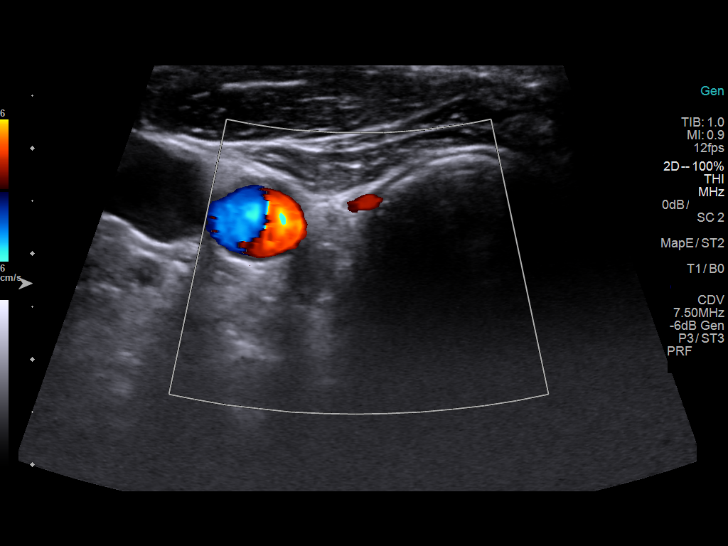
[im 5/23]
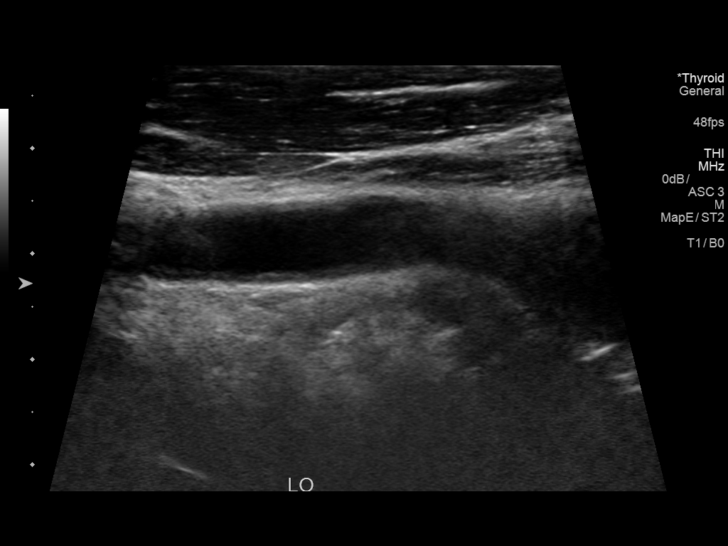
[im 6/23]
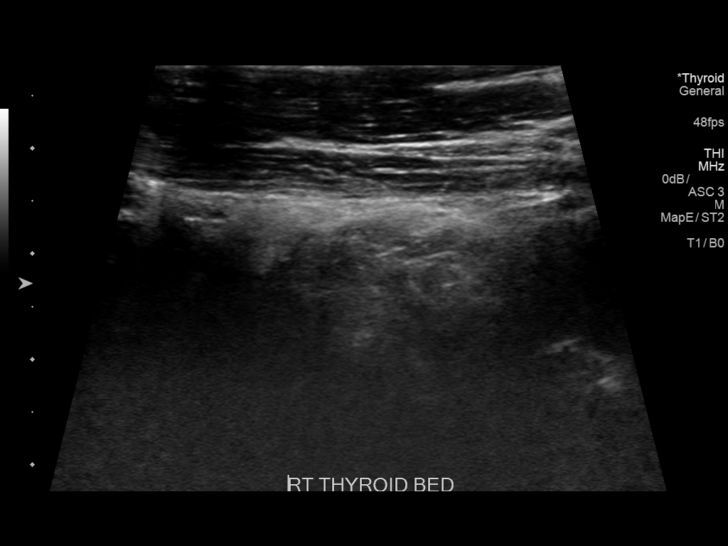
[im 8/23]
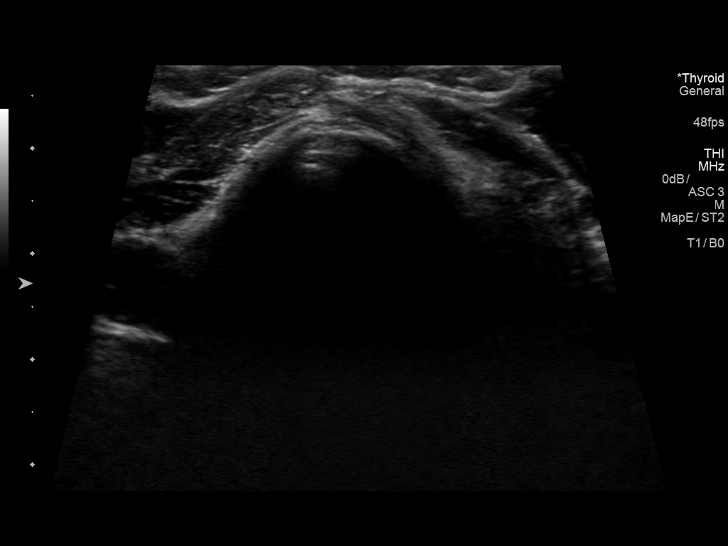
[im 10/23]
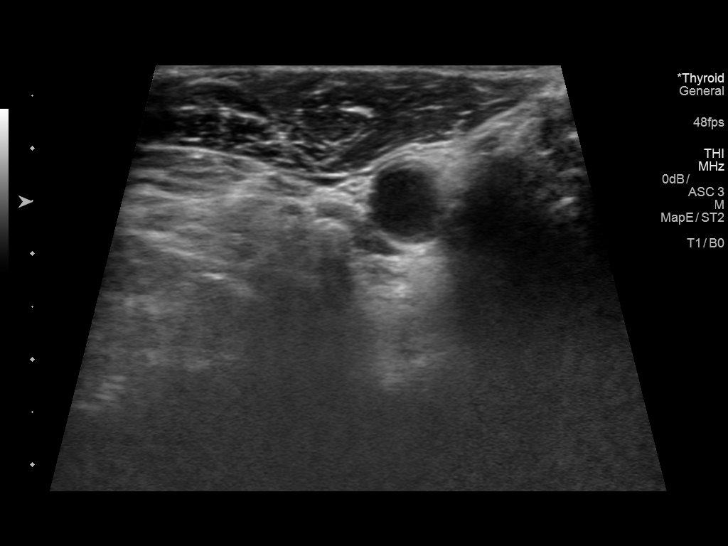
[im 11/23]
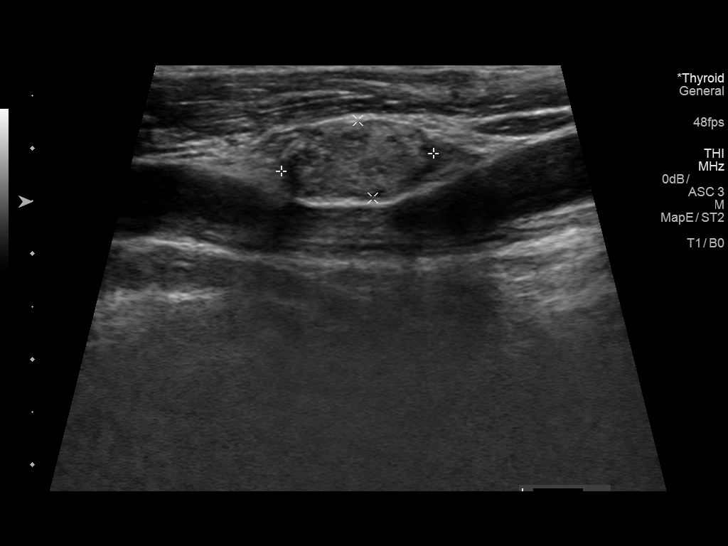
[im 13/23]
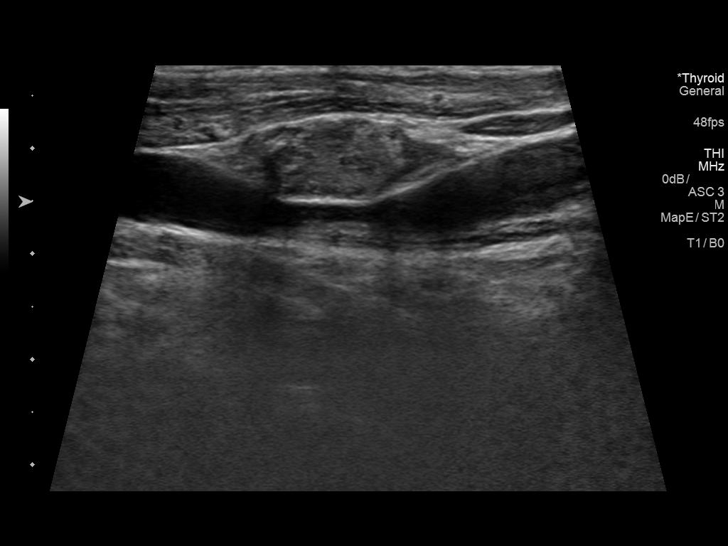
[im 14/23]
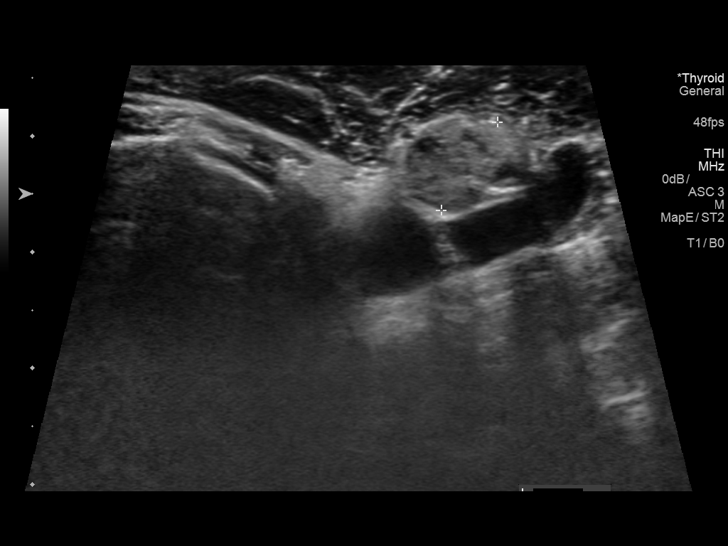
[im 16/23]
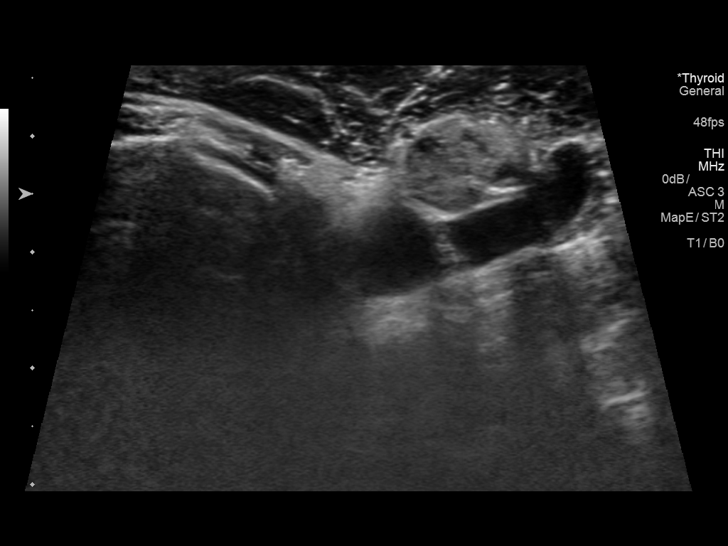
[im 18/23]
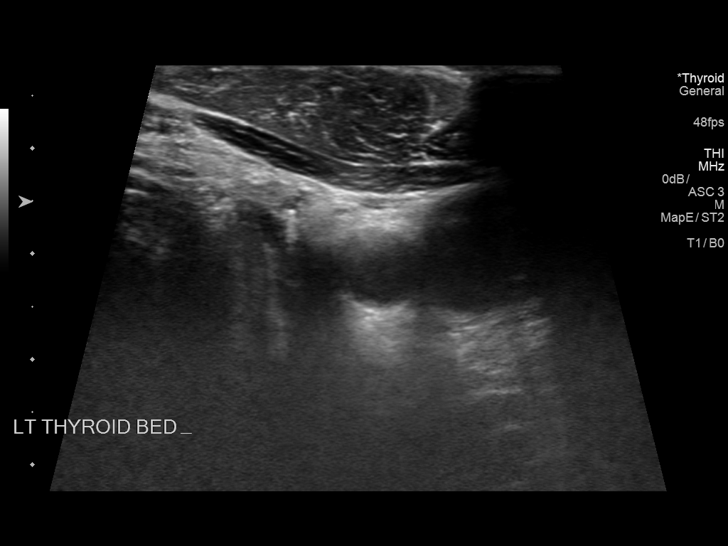
[im 19/23]
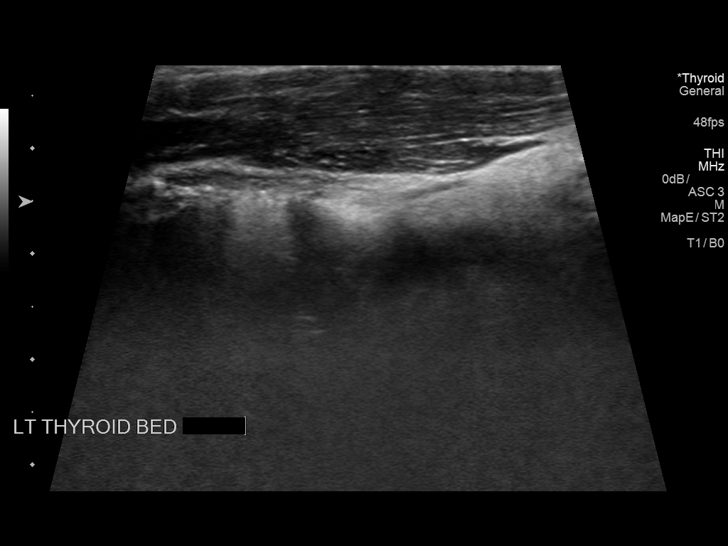
[im 21/23]
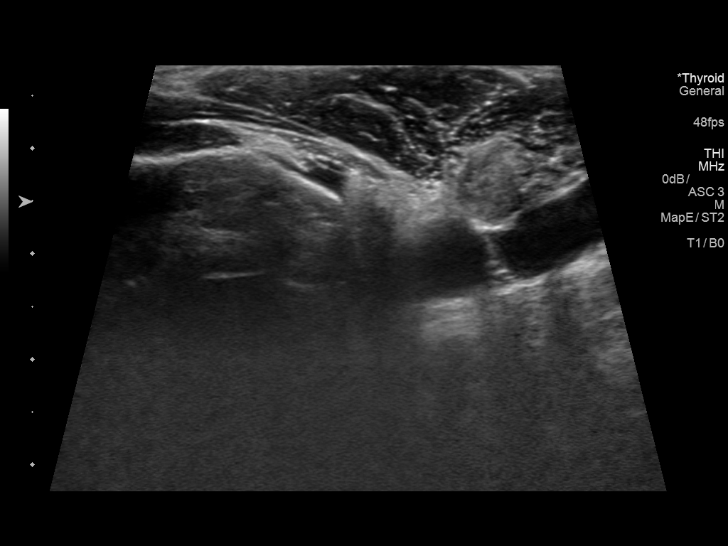
[im 23/23]
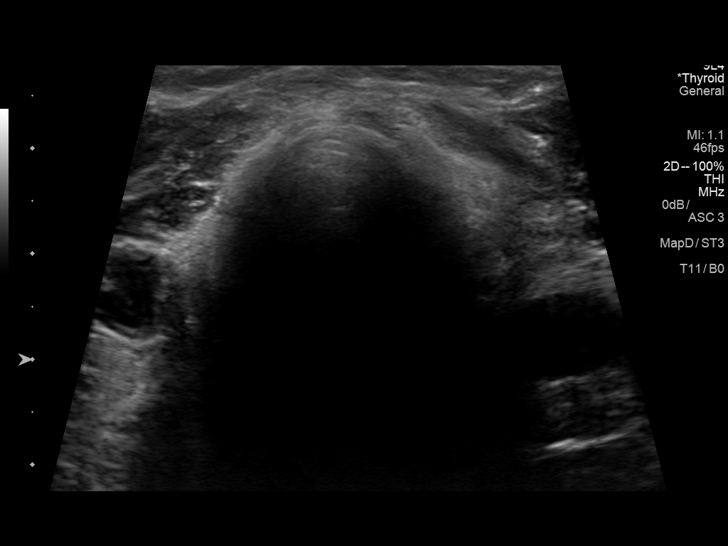

[14 of 23 positions shown; findings below may reference images not displayed]

FINDINGS: Parenchymal Echotexture: None seen

Isthmus: Surgically absent

Right lobe: Surgically absent

Left lobe: Surgically absent

_________________________________________________________

Estimated total number of nodules >/= 1 cm: 1

Number of spongiform nodules >/=  2 cm not described below (TR1): 0

Number of mixed cystic and solid nodules >/= 1.5 cm not described
below (TR2): 0

_________________________________________________________

There is a 0.7 x 0.5 cm hypoechoic nodule in the inferior aspect of
the thyroidectomy bed on the right.

1.5 x 0.9 x 0.8 cm nodule or pathologic lymph node lateral to the
left thyroidectomy bed.
IMPRESSION: 1. Nodule versus pathologic lymph node lateral to the left
thyroidectomy bed, corresponding to focus of activity seen on prior
scintigraphy. Consider excisional or FNA biopsy.

The above is in keeping with the ACR TI-RADS recommendations - [HOSPITAL] 4974;[DATE].

## 2020-08-24 IMAGING — US US BIOPSY LYMPH NODE
1 series · 11 of 11 positions shown · non-contrast
Comparison: none

INDICATION: 57-year-old with history of papillary thyroid cancer and status post
resection with positive lymph nodes. Recent ultrasound demonstrated
a suspicious nodule on the left side of the neck.

[Series 1: us biopsy lymph node · 0.06mm/px · 11 acquisitions, 11 frames shown]
[im 1/11]
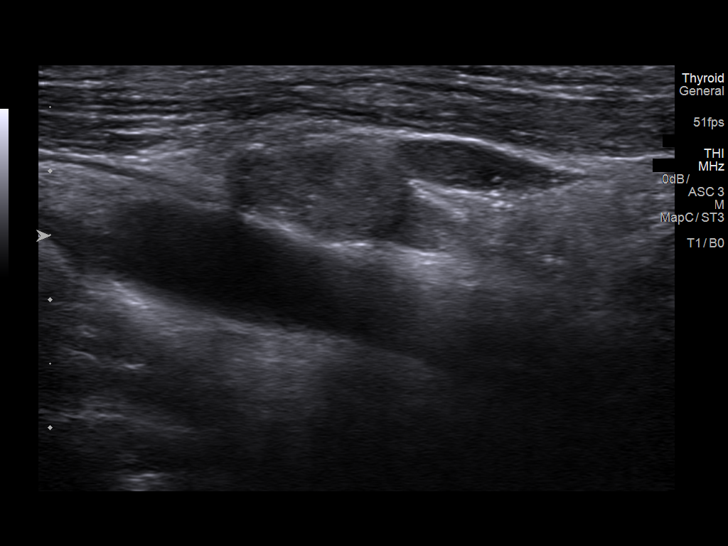
[im 2/11]
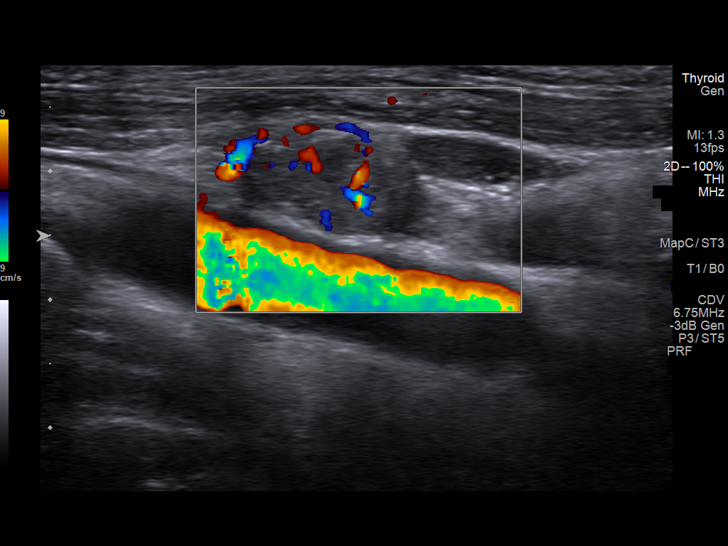
[im 3/11]
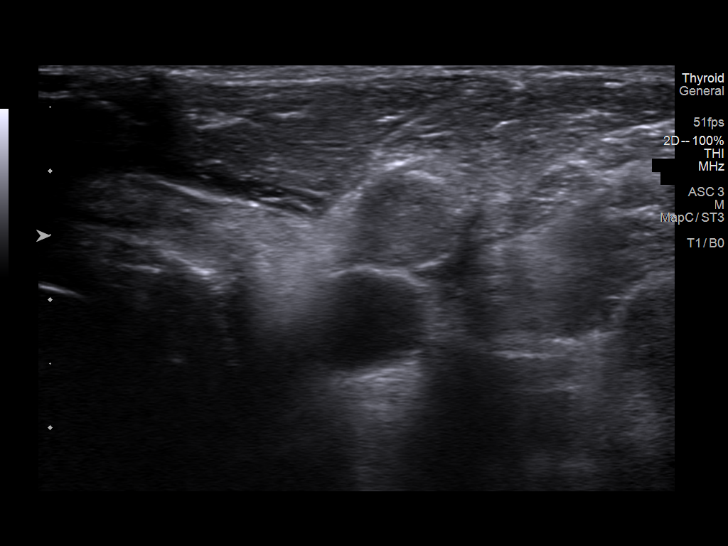
[im 4/11]
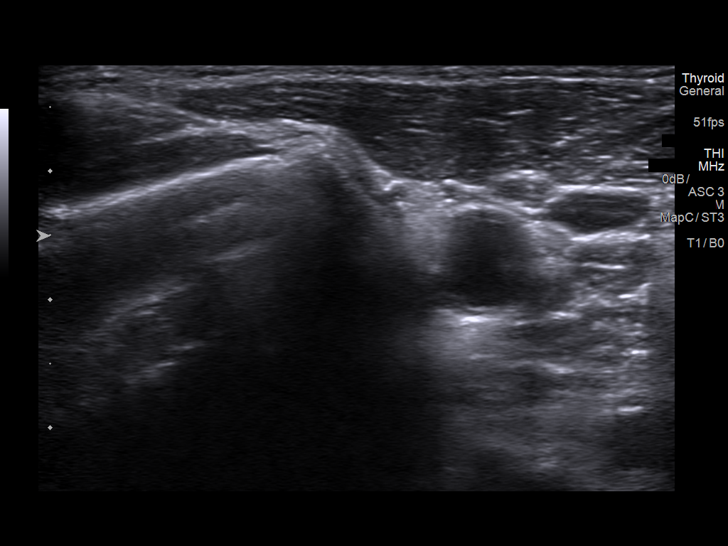
[im 5/11]
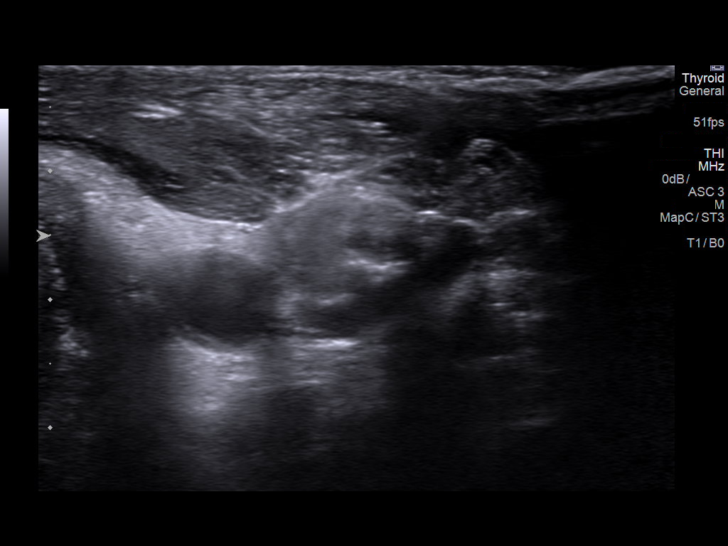
[im 6/11]
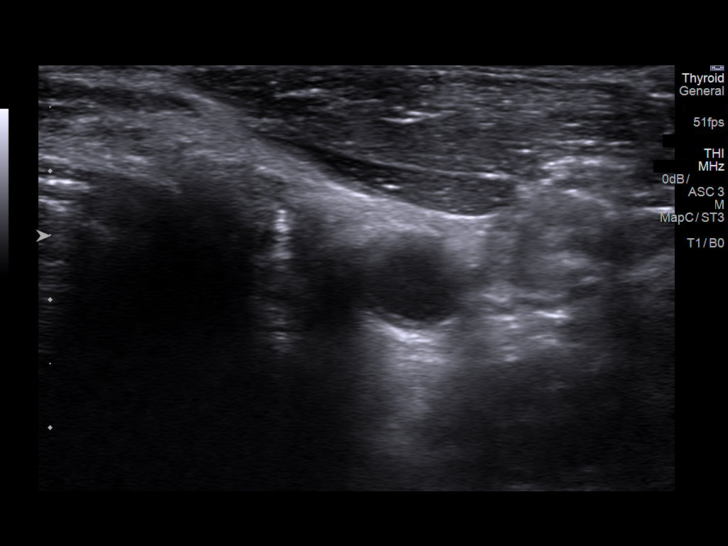
[im 7/11]
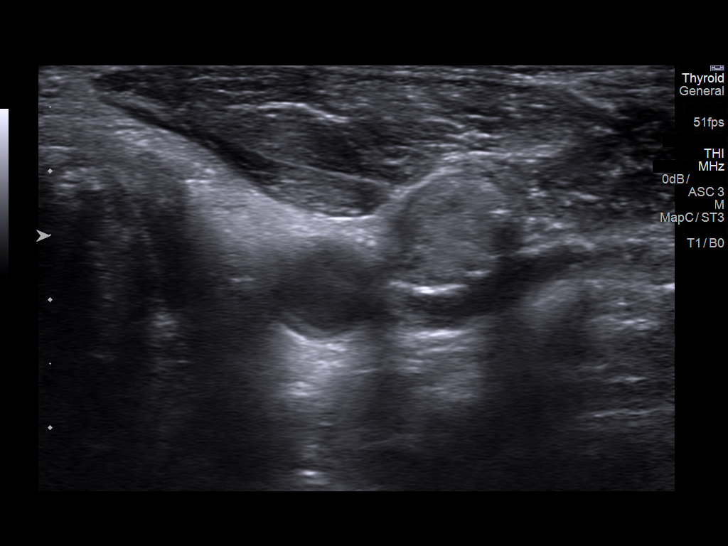
[im 8/11]
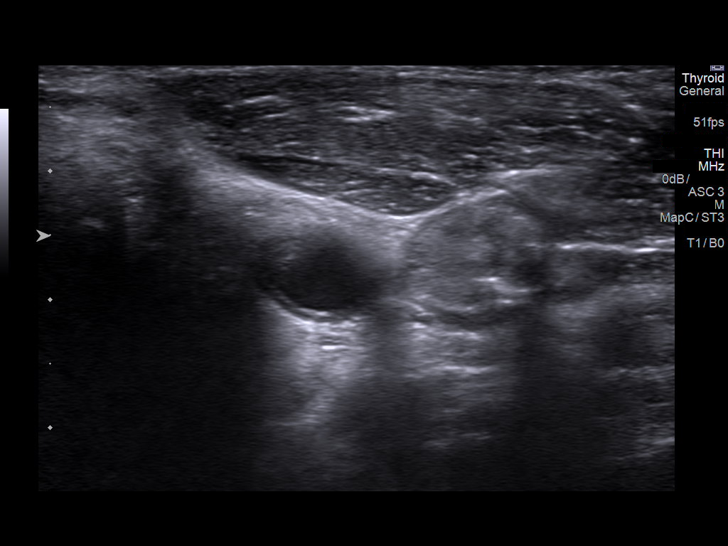
[im 9/11]
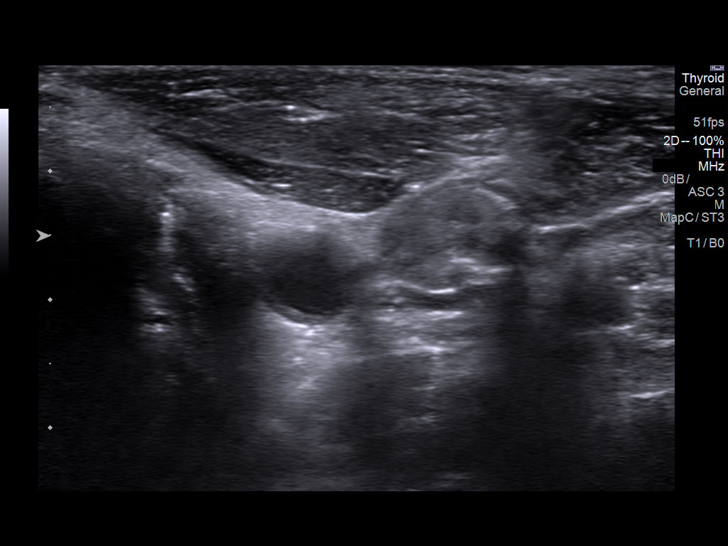
[im 10/11]
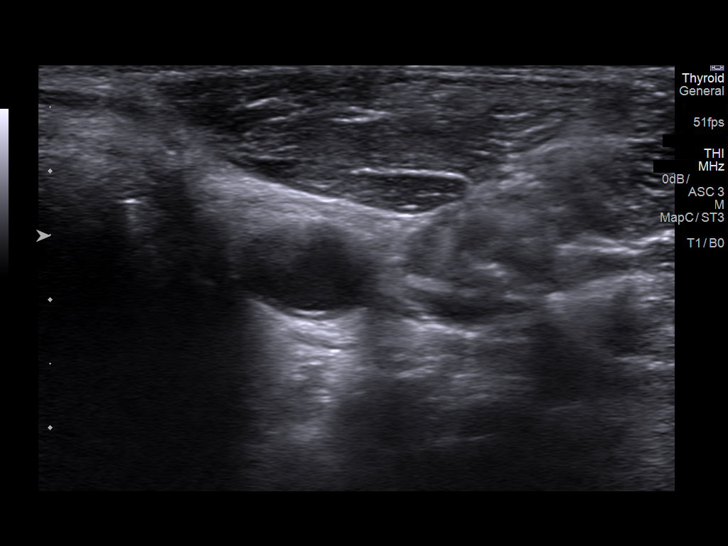
[im 11/11]
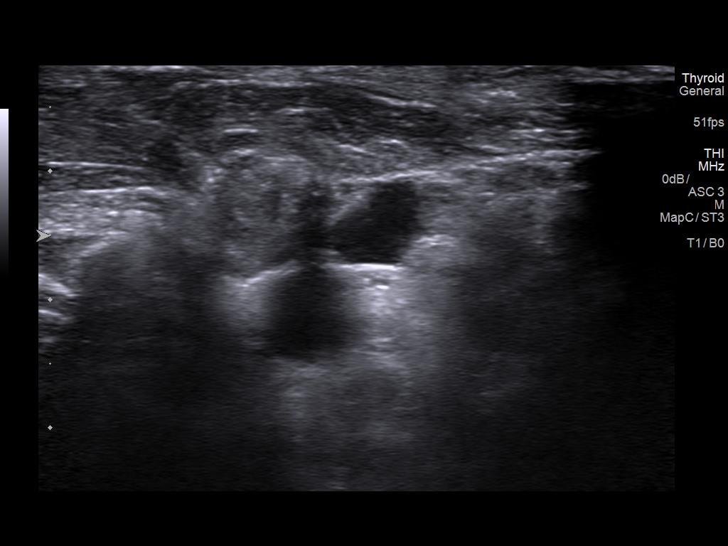

[11 of 11 positions shown; findings below may reference images not displayed]

EXAM:
ULTRASOUND-GUIDED FINE NEEDLE ASPIRATION OF LEFT NECK NODULE/LYMPH
NODE

MEDICATIONS:
None.

ANESTHESIA/SEDATION:
None

FLUOROSCOPY TIME:  None

COMPLICATIONS:
None immediate.

PROCEDURE:
Informed written consent was obtained from the patient after a
thorough discussion of the procedural risks, benefits and
alternatives. All questions were addressed. A timeout was performed
prior to the initiation of the procedure.

Left side of the neck was evaluated with ultrasound. The suspicious
nodule was identified. The neck was prepped with chlorhexidine and
sterile field was created. Skin was anesthetized with 1% lidocaine.
Using ultrasound guidance, 6 fine-needle aspirations were performed
with 25 gauge needles. Bandage placed over the puncture site.
FINDINGS: Mildly heterogeneous nodule along the anterior and medial aspect of
the left internal jugular vein. Nodule is adjacent to the left
common carotid artery. Needle position confirmed within the lesion
on all occasions. Due to the nodule location, core biopsies could
not be safely performed.
IMPRESSION: Successful ultrasound-guided fine-needle aspiration of the
suspicious nodule in the left neck.

## 2020-09-11 DIAGNOSIS — Z85850 Personal history of malignant neoplasm of thyroid: Secondary | ICD-10-CM | POA: Diagnosis not present

## 2020-09-11 DIAGNOSIS — E890 Postprocedural hypothyroidism: Secondary | ICD-10-CM | POA: Diagnosis not present

## 2020-09-18 DIAGNOSIS — Z85850 Personal history of malignant neoplasm of thyroid: Secondary | ICD-10-CM | POA: Diagnosis not present

## 2020-09-18 DIAGNOSIS — E890 Postprocedural hypothyroidism: Secondary | ICD-10-CM | POA: Diagnosis not present

## 2020-09-18 DIAGNOSIS — Z8546 Personal history of malignant neoplasm of prostate: Secondary | ICD-10-CM | POA: Diagnosis not present

## 2020-11-15 ENCOUNTER — Other Ambulatory Visit: Payer: Self-pay | Admitting: Internal Medicine

## 2020-12-03 NOTE — Progress Notes (Signed)
Cardiology Office Note   Date:  12/04/2020   ID:  [name], DOB 10/13/1961, MRN [id number]  PCP:  Lawerance Cruel, MD  Cardiologist:   [name], MD    F/U of CAD    History of Present Illness: Todd Wood is a 60 y.o. male with a history of coronary calcifications   On CT  Pt had CT in Dec 2019  Showed calcifications of LM and LAD    He went on to have a CT coaronary angiogram done   Ca score was 203   There was diffuse CAD   LAD appeared occludied in mid portion  He went on to have cath on 04/30/19   This showed:   100% mid LAD   80% mid [place]   He underwent PTCA/DES to LCx  He was started on Crestor  LDL 55 from 155   HDL 35    I swa the pt in clinic in Jan 2021 He deneis CP  Breathing is OK  No dizziness   Active   Eats pretty good   Tries to watch sugars   Current Meds  Medication Sig  . aspirin EC 81 MG tablet Take 81 mg by mouth daily.  . clopidogrel (PLAVIX) 75 MG tablet TAKE 1 TABLET BY MOUTH EVERY DAY  . nitroGLYCERIN (NITROSTAT) 0.4 MG SL tablet Place 1 tablet (0.4 mg total) under the tongue every 5 (five) minutes as needed for chest pain.  . rosuvastatin (CRESTOR) 20 MG tablet TAKE 1 TABLET BY MOUTH EVERY DAY  . SYNTHROID 175 MCG tablet Take 175 mcg by mouth daily before breakfast.     Allergies:   Patient has no known allergies.   Past Medical History:  Diagnosis Date  . CAD (coronary artery disease)    a. 04/30/2019: Chronically occluded LAD with collaterals, PCI to mid-distal LCX  . Hypothyroidism   . Neoplasm of uncertain behavior of thyroid gland 11/14/2015  . Papillary thyroid carcinoma ([place]) 2017   stage III  . Prostate cancer ([place]) 2007  . Recurrent thyroid cancer ([place]) 10/09/2016  . Thyroid cancer ([place]) 01/06/2019    Past Surgical History:  Procedure Laterality Date  . COLONOSCOPY     x 2  . CORONARY STENT INTERVENTION N/A 04/30/2019   Procedure: CORONARY STENT INTERVENTION;  Surgeon: Wellington Hampshire, MD;  Location: [facility];   Service: Cardiovascular;  Laterality: N/A;  . LEFT HEART CATH AND CORONARY ANGIOGRAPHY N/A 04/30/2019   Procedure: LEFT HEART CATH AND CORONARY ANGIOGRAPHY;  Surgeon: Wellington Hampshire, MD;  Location: [facility];  Service: Cardiovascular;  Laterality: N/A;  . MASS EXCISION Left 10/09/2016   Procedure: EXCISION RESIDUAL THYROID CANCER;  Surgeon: Jodi Marble, MD;  Location: [facility];  Service: ENT;  Laterality: Left;  . PROSTATECTOMY    . THYROIDECTOMY N/A 11/16/2015   Procedure: TOTAL THYROIDECTOMY WITH CENTRAL COMPARTMENT LYMPH NODE DISSECTION;  Surgeon: Armandina Gemma, MD;  Location[facility];  Service: General;  Laterality: N/A;  . THYROIDECTOMY Left 01/06/2019   Procedure: Left Revision THYROIDECTOMY with neck dissection;  Surgeon: Izora Gala, MD;  Location: [facility];  Service: ENT;  Laterality: Left;     Social History:  The patient  reports that he has never smoked. He has never used smokeless tobacco. He reports current alcohol use of about 7.0 standard drinks of alcohol per week. He reports that he does not use drugs.   Family History:  The patient's family history includes Alcohol abuse  in his father; Diabetes in his sister; Liver cancer in his mother; Prostate cancer in his father.    ROS:  Please see the history of present illness. All other systems are reviewed and  Negative to the above problem except as noted.    PHYSICAL EXAM: VS:  BP 128/86   Pulse 60   Ht 6' (1.829 m)   Wt 218 lb (98.9 kg)   BMI 29.57 kg/m   GEN: Well nourished, well developed, in no acute distress  HEENT: normal  Neck: no JVD, murmur over R carotid   Cardiac: RRR; no murmurs  No LE  edema  Respiratory:  clear to auscultation bilaterally, normal work of breathing GI: soft, nontender, nondistended, + BS  No hepatomegaly  MS: no deformity Moving all extremities   Skin: warm and dry, no rash Neuro:  Strength and sensation are intact Psych: euthymic mood, full affect   EKG:  EKG is ordered today.   NSR 60      Lipid Panel    Component Value Date/Time   CHOL 107 06/04/2019 0839   TRIG 83 06/04/2019 0839   HDL 35 (L) 06/04/2019 0839   CHOLHDL 3.1 06/04/2019 0839   LDLCALC 55 06/04/2019 0839      Wt Readings from Last 3 Encounters:  12/04/20 218 lb (98.9 kg)  11/12/19 218 lb 1.9 oz (98.9 kg)  05/10/19 217 lb (98.4 kg)      ASSESSMENT AND PLAN:  1  CAD   Pt with CAD that was incidentally discovered on CT  THen underwent cath  with intervention to LCx    LAD occluded    The pt remains symptom free   Active Will review use of Plavix    2  HL  Check lipid today   3   Bruit   WIll set up for carotid USN  4   Thyroid   Will check TSH today   Check  CBC , CMET, TSH< Lipids and PSA     F/U in 12 months     Current medicines are reviewed at length with the patient today.  The patient does not have concerns regarding medicines.  Signed, [name], MD  12/04/2020 10:36 AM    [address], [place], [place]  [address] Phone: [phone]; Fax: [phone]

## 2020-12-04 ENCOUNTER — Ambulatory Visit (INDEPENDENT_AMBULATORY_CARE_PROVIDER_SITE_OTHER): Payer: BC Managed Care – PPO | Admitting: Internal Medicine

## 2020-12-04 ENCOUNTER — Ambulatory Visit: Payer: BC Managed Care – PPO | Admitting: Internal Medicine

## 2020-12-04 ENCOUNTER — Other Ambulatory Visit: Payer: Self-pay

## 2020-12-04 ENCOUNTER — Encounter: Payer: Self-pay | Admitting: Internal Medicine

## 2020-12-04 VITALS — BP 128/86 | HR 60 | Ht 72.0 in | Wt 218.0 lb

## 2020-12-04 DIAGNOSIS — I2510 Atherosclerotic heart disease of native coronary artery without angina pectoris: Secondary | ICD-10-CM

## 2020-12-04 DIAGNOSIS — E785 Hyperlipidemia, unspecified: Secondary | ICD-10-CM | POA: Diagnosis not present

## 2020-12-04 DIAGNOSIS — E038 Other specified hypothyroidism: Secondary | ICD-10-CM

## 2020-12-04 DIAGNOSIS — Z79899 Other long term (current) drug therapy: Secondary | ICD-10-CM

## 2020-12-04 DIAGNOSIS — E782 Mixed hyperlipidemia: Secondary | ICD-10-CM

## 2020-12-04 DIAGNOSIS — I10 Essential (primary) hypertension: Secondary | ICD-10-CM

## 2020-12-04 DIAGNOSIS — R0989 Other specified symptoms and signs involving the circulatory and respiratory systems: Secondary | ICD-10-CM

## 2020-12-04 LAB — LIPID PANEL
Chol/HDL Ratio: 2.6 ratio (ref 0.0–5.0)
Cholesterol, Total: 131 mg/dL (ref 100–199)
HDL: 50 mg/dL (ref >39)
LDL Chol Calc (NIH): 72 mg/dL (ref 0–99)
Triglycerides: 35 mg/dL (ref 0–149)
VLDL Cholesterol Cal: 9 mg/dL (ref 5–40)

## 2020-12-04 LAB — COMPREHENSIVE METABOLIC PANEL
ALT: 18 IU/L (ref 0–44)
AST: 19 IU/L (ref 0–40)
Albumin/Globulin Ratio: 2.4 — ABNORMAL HIGH (ref 1.2–2.2)
Albumin: 4.5 g/dL (ref 3.8–4.9)
Alkaline Phosphatase: 67 IU/L (ref 44–121)
BUN/Creatinine Ratio: 19 (ref 10–24)
BUN: 16 mg/dL (ref 8–27)
Bilirubin Total: 0.4 mg/dL (ref 0.0–1.2)
CO2: 24 mmol/L (ref 20–29)
Calcium: 8.9 mg/dL (ref 8.6–10.2)
Chloride: 106 mmol/L (ref 96–106)
Creatinine, Ser: 0.85 mg/dL (ref 0.76–1.27)
GFR calc Af Amer: 109 mL/min/{1.73_m2} (ref >59)
GFR calc non Af Amer: 95 mL/min/{1.73_m2} (ref >59)
Globulin, Total: 1.9 g/dL (ref 1.5–4.5)
Glucose: 133 mg/dL — ABNORMAL HIGH (ref 65–99)
Potassium: 4.4 mmol/L (ref 3.5–5.2)
Sodium: 141 mmol/L (ref 134–144)
Total Protein: 6.4 g/dL (ref 6.0–8.5)

## 2020-12-04 LAB — TSH: TSH: 0.052 u[IU]/mL — ABNORMAL LOW (ref 0.450–4.500)

## 2020-12-04 LAB — PSA: Prostate Specific Ag, Serum: <0.1 ng/mL (ref 0.0–4.0)

## 2020-12-04 LAB — CBC
Hematocrit: 41.3 % (ref 37.5–51.0)
Hemoglobin: 13.7 g/dL (ref 13.0–17.7)
MCH: 31.1 pg (ref 26.6–33.0)
MCHC: 33.2 g/dL (ref 31.5–35.7)
MCV: 94 fL (ref 79–97)
Platelets: 207 10*3/uL (ref 150–450)
RBC: 4.41 x10E6/uL (ref 4.14–5.80)
RDW: 12.5 % (ref 11.6–15.4)
WBC: 3.1 10*3/uL — ABNORMAL LOW (ref 3.4–10.8)

## 2020-12-04 MED ORDER — NITROGLYCERIN 0.4 MG SL SUBL: 0.4000 mg | SUBLINGUAL_TABLET | SUBLINGUAL | 3 refills | Status: DC | PRN

## 2020-12-04 NOTE — Patient Instructions (Signed)
Medication Instructions:  Your physician recommends that you continue on your current medications as directed. Please refer to the Current Medication list given to you today.  *If you need a refill on your cardiac medications before your next appointment, please call your pharmacy*   Lab Work: CMET, CBC, LIPID, TSH, PSA .... TODAY  If you have labs (blood work) drawn today and your tests are completely normal, you will receive your results only by: Marland Kitchen MyChart Message (if you have MyChart) OR . A paper copy in the mail If you have any lab test that is abnormal or we need to change your treatment, we will call you to review the results.   Testing/Procedures: Your physician has requested that you have a carotid duplex. This test is an ultrasound of the carotid arteries in your neck. It looks at blood flow through these arteries that supply the brain with blood. Allow one hour for this exam. There are no restrictions or special instructions.     Follow-Up: At [facility], you and your health needs are our priority.  As part of our continuing mission to provide you with exceptional heart care, we have created designated Provider Care Teams.  These Care Teams include your primary Cardiologist (physician) and Advanced Practice Providers (APPs -  Physician Assistants and Nurse Practitioners) who all work together to provide you with the care you need, when you need it.  We recommend signing up for the patient portal called "MyChart".  Sign up information is provided on this After Visit Summary.  MyChart is used to connect with patients for Virtual Visits (Telemedicine).  Patients are able to view lab/test results, encounter notes, upcoming appointments, etc.  Non-urgent messages can be sent to your provider as well.   To learn more about what you can do with MyChart, go to [email or web address].    Your next appointment:   1 year(s)  The format for your next appointment:   In  Person  Provider:   [name], MD   Other Instructions

## 2020-12-06 ENCOUNTER — Telehealth: Payer: Self-pay

## 2020-12-06 DIAGNOSIS — Z79899 Other long term (current) drug therapy: Secondary | ICD-10-CM

## 2020-12-06 DIAGNOSIS — E038 Other specified hypothyroidism: Secondary | ICD-10-CM

## 2020-12-06 MED ORDER — LEVOTHYROXINE SODIUM 125 MCG PO TABS: 125.0000 ug | ORAL_TABLET | Freq: Every day | ORAL | 3 refills | Status: DC

## 2020-12-06 NOTE — Telephone Encounter (Signed)
Pt advised his lab results and will send his new RX for the change in his thyroid med.. pt to return 01/2021 for repeat labs.

## 2020-12-06 NOTE — Telephone Encounter (Signed)
-----   Message from [name] V, MD sent at 12/04/2020 11:37 PM EST ----- CBC is OK Kidney and liver function are normal  Electrolytes are OK LIpids are very good  Thyroid function is off   On too much   I would switch to 125 mcg per day    F/U TSH in 2 monthns

## 2020-12-07 DIAGNOSIS — D440 Neoplasm of uncertain behavior of thyroid gland: Secondary | ICD-10-CM

## 2020-12-07 NOTE — Telephone Encounter (Signed)
Per pt message the dose of synthroid we had in his chart (175 mcg) was incorrect.  He was only on 135 mcg per patient.   Pt was contacted and instructed to decrease dose to 125 mcg based on recent lab test.  Will route to Dr. Harrington Challenger for any recommendations based on this.

## 2020-12-11 ENCOUNTER — Telehealth: Payer: Self-pay | Admitting: Internal Medicine

## 2020-12-11 NOTE — Telephone Encounter (Signed)
I would recomm going down to 100 mcg since he is on 135 now  F/U TSH in 8 wks

## 2020-12-12 MED ORDER — LEVOTHYROXINE SODIUM 100 MCG PO TABS: 100.0000 ug | ORAL_TABLET | Freq: Every day | ORAL | 3 refills | Status: DC

## 2020-12-20 ENCOUNTER — Other Ambulatory Visit: Payer: Self-pay

## 2020-12-20 ENCOUNTER — Ambulatory Visit (HOSPITAL_COMMUNITY)
Admission: RE | Admit: 2020-12-20 | Discharge: 2020-12-20 | Disposition: A | Discharge status: Home / Self Care | Payer: BC Managed Care – PPO | Source: Ambulatory Visit | Attending: Cardiology | Admitting: Cardiology

## 2020-12-20 DIAGNOSIS — Z79899 Other long term (current) drug therapy: Secondary | ICD-10-CM | POA: Insufficient documentation

## 2020-12-20 DIAGNOSIS — I10 Essential (primary) hypertension: Secondary | ICD-10-CM | POA: Diagnosis not present

## 2020-12-20 DIAGNOSIS — E038 Other specified hypothyroidism: Secondary | ICD-10-CM | POA: Diagnosis not present

## 2020-12-20 DIAGNOSIS — R0989 Other specified symptoms and signs involving the circulatory and respiratory systems: Secondary | ICD-10-CM | POA: Diagnosis not present

## 2020-12-20 DIAGNOSIS — E785 Hyperlipidemia, unspecified: Secondary | ICD-10-CM | POA: Insufficient documentation

## 2020-12-20 DIAGNOSIS — I2510 Atherosclerotic heart disease of native coronary artery without angina pectoris: Secondary | ICD-10-CM | POA: Diagnosis not present

## 2020-12-20 DIAGNOSIS — E782 Mixed hyperlipidemia: Secondary | ICD-10-CM | POA: Insufficient documentation

## 2021-01-10 ENCOUNTER — Other Ambulatory Visit: Payer: Self-pay | Admitting: Internal Medicine

## 2021-01-21 ENCOUNTER — Other Ambulatory Visit: Payer: Self-pay | Admitting: Internal Medicine

## 2021-01-25 NOTE — Telephone Encounter (Signed)
Called patient and informed to continue aspirin and plavix until last day of July. He understands to stop Plavix at that point and continue aspirin 81 mg daily.  Has current supply.  No further needs at this time.

## 2021-01-25 NOTE — Telephone Encounter (Signed)
REviewed with interventional service    WOuld recomm ASA and Plavix until end of July (about 2 years from intervention)  They would then recomm ecASA 81 mg daily.

## 2021-02-07 ENCOUNTER — Other Ambulatory Visit: Payer: BC Managed Care – PPO | Admitting: *Deleted

## 2021-02-07 ENCOUNTER — Other Ambulatory Visit: Payer: Self-pay

## 2021-02-07 DIAGNOSIS — Z79899 Other long term (current) drug therapy: Secondary | ICD-10-CM | POA: Diagnosis not present

## 2021-02-07 DIAGNOSIS — E038 Other specified hypothyroidism: Secondary | ICD-10-CM | POA: Diagnosis not present

## 2021-02-07 DIAGNOSIS — D440 Neoplasm of uncertain behavior of thyroid gland: Secondary | ICD-10-CM

## 2021-02-07 LAB — TSH: TSH: 4.19 u[IU]/mL (ref 0.450–4.500)

## 2021-02-12 NOTE — Telephone Encounter (Signed)
The action of the thyroid effects overall metabolism of body and affects all organs   Heart is affected (affects HR and contractility) When level was checked earlier this year I was not aware that goal was to keep TSH suppressed.    I would reocmm forwarding all labs to Dr Loletha Grayer Harle Battiest and others who are involved in adjusting doses.    Decision for best dose has to be made after review of all concerns  Please forward to [place]

## 2021-02-15 NOTE — Telephone Encounter (Signed)
I recently checked the TSH on Todd Wood and adjusted it    I was not aware that the goal was to suppress thyroid function given Hx of cancer.  Change was mad only a few months ago.    I apologize  Todd Wood is now concerned    I am writing to get clarifcation for goals.  [name]

## 2021-02-15 NOTE — Telephone Encounter (Signed)
I have sent a note to Dr Buddy Duty reTSH and dosing

## 2021-03-13 DIAGNOSIS — E890 Postprocedural hypothyroidism: Secondary | ICD-10-CM | POA: Diagnosis not present

## 2021-03-13 DIAGNOSIS — Z85850 Personal history of malignant neoplasm of thyroid: Secondary | ICD-10-CM | POA: Diagnosis not present

## 2021-03-16 DIAGNOSIS — E890 Postprocedural hypothyroidism: Secondary | ICD-10-CM | POA: Diagnosis not present

## 2021-03-16 DIAGNOSIS — Z85850 Personal history of malignant neoplasm of thyroid: Secondary | ICD-10-CM | POA: Diagnosis not present

## 2021-03-16 DIAGNOSIS — Z8546 Personal history of malignant neoplasm of prostate: Secondary | ICD-10-CM | POA: Diagnosis not present

## 2021-05-04 DIAGNOSIS — H2513 Age-related nuclear cataract, bilateral: Secondary | ICD-10-CM | POA: Diagnosis not present

## 2021-05-04 DIAGNOSIS — H524 Presbyopia: Secondary | ICD-10-CM | POA: Diagnosis not present

## 2021-05-04 MED ORDER — NITROGLYCERIN 0.4 MG SL SUBL: 0.4000 mg | SUBLINGUAL_TABLET | SUBLINGUAL | 6 refills | Status: AC | PRN

## 2022-01-18 ENCOUNTER — Other Ambulatory Visit: Payer: Self-pay | Admitting: Internal Medicine

## 2022-03-07 DIAGNOSIS — E890 Postprocedural hypothyroidism: Secondary | ICD-10-CM | POA: Diagnosis not present

## 2022-03-07 DIAGNOSIS — Z85850 Personal history of malignant neoplasm of thyroid: Secondary | ICD-10-CM | POA: Diagnosis not present

## 2022-03-07 DIAGNOSIS — Z8546 Personal history of malignant neoplasm of prostate: Secondary | ICD-10-CM | POA: Diagnosis not present

## 2022-03-15 DIAGNOSIS — Z8546 Personal history of malignant neoplasm of prostate: Secondary | ICD-10-CM | POA: Diagnosis not present

## 2022-03-15 DIAGNOSIS — E890 Postprocedural hypothyroidism: Secondary | ICD-10-CM | POA: Diagnosis not present

## 2022-03-15 DIAGNOSIS — Z85850 Personal history of malignant neoplasm of thyroid: Secondary | ICD-10-CM | POA: Diagnosis not present

## 2022-04-08 NOTE — Progress Notes (Signed)
Cardiology Office Note   Date:  04/11/2022   ID:  Todd Wood, DOB 1961/08/11, MRN [id number]  PCP:  Lawerance Cruel, MD  Cardiologist:   [name], MD    F/U of CAD    History of Present Illness: Todd Wood is a 61 y.o. 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   nitroGLYCERIN (NITROSTAT) 0.4 MG SL tablet Place 1 tablet (0.4 mg total) under the tongue every 5 (five) minutes as needed for chest pain.   rosuvastatin (CRESTOR) 20 MG tablet TAKE 1 TABLET BY MOUTH EVERY DAY   SYNTHROID 137 MCG tablet Take 137 mcg by mouth every morning.     Allergies:   Patient has no known allergies.   Past Medical History:  Diagnosis Date   CAD (coronary artery disease)    a. 04/30/2019: Chronically occluded LAD with collaterals, PCI to mid-distal LCX   Hypothyroidism    Neoplasm of uncertain behavior of thyroid gland 11/14/2015   Papillary thyroid carcinoma ([place]) 2017   stage III   Prostate cancer ([place]) 2007   Recurrent thyroid cancer ([place]) 10/09/2016   Thyroid cancer ([place]) 01/06/2019    Past Surgical History:  Procedure Laterality Date   COLONOSCOPY     x 2   CORONARY STENT INTERVENTION N/A 04/30/2019   Procedure: CORONARY STENT INTERVENTION;  Surgeon: Wellington Hampshire, MD;  Location: [facility];  Service: Cardiovascular;  Laterality: N/A;   LEFT HEART CATH AND CORONARY ANGIOGRAPHY N/A  04/30/2019   Procedure: LEFT HEART CATH AND CORONARY ANGIOGRAPHY;  Surgeon: Wellington Hampshire, MD;  Location: [facility];  Service: Cardiovascular;  Laterality: N/A;   MASS EXCISION Left 10/09/2016   Procedure: EXCISION RESIDUAL THYROID CANCER;  Surgeon: Jodi Marble, MD;  Location: [facility];  Service: ENT;  Laterality: Left;   PROSTATECTOMY     THYROIDECTOMY N/A 11/16/2015   Procedure: TOTAL THYROIDECTOMY WITH CENTRAL COMPARTMENT LYMPH NODE DISSECTION;  Surgeon: Armandina Gemma, MD;  [facility];  Service: General;  Laterality: N/A;   THYROIDECTOMY Left 01/06/2019   Procedure: Left Revision THYROIDECTOMY with neck dissection;  Surgeon: Izora Gala, MD;  Location: [facility];  Service: ENT;  Laterality: Left;     Social History:  The patient  reports that he has never smoked. He has never used smokeless tobacco. He reports current alcohol use of about 7.0 standard drinks of alcohol per week. He reports that he does not use drugs.   Family History:  The patient's family history includes Alcohol abuse in his father; Diabetes in his sister; Liver cancer in his mother; Prostate  cancer in his father.    ROS:  Please see the history of present illness. All other systems are reviewed and  Negative to the above problem except as noted.    PHYSICAL EXAM: VS:  BP 120/68   Pulse 63   Ht 6' (1.829 m)   Wt 229 lb (103.9 kg)   SpO2 96%   BMI 31.06 kg/m   GEN: Well nourished, well developed, in no acute distress  HEENT: normal  Neck: no JVD, soft murmur over R carotid   Cardiac: RRR; no murmurs  No LE  edema  Respiratory:  clear to auscultation bilaterally,  GI: soft, nontender, nondistended, + BS  No hepatomegaly  MS: no deformity Moving all extremities   Skin: warm and dry, no rash Neuro:  Strength and sensation are intact Psych: euthymic mood, full affect   EKG:  EKG is ordered today.  NSR 63 bpm  Nonspecific ST changes   Lipid Panel    Component Value Date/Time   CHOL 131  12/04/2020 1117   TRIG 35 12/04/2020 1117   HDL 50 12/04/2020 1117   CHOLHDL 2.6 12/04/2020 1117   LDLCALC 72 12/04/2020 1117      Wt Readings from Last 3 Encounters:  04/11/22 229 lb (103.9 kg)  12/04/20 218 lb (98.9 kg)  11/12/19 218 lb 1.9 oz (98.9 kg)      ASSESSMENT AND PLAN:  1  CAD   s/p PTCA/DES to LCx   LAD occluded (JUly 2020)   DOing well clinically  No symptoms of angina    Keep risk factor modification Eat healthy    2  HL  Check lipomed, Lpa, ApoB  3  CV dz  Mild on carotid USN in 2022  4   Thyroid   Will check TSH   5  Diet  Limit carbs    Limt sugar to 36 g added per day     Check  CBC , CMET, TSH< Lipomed, Lpa, ApoB, A1c  F/U in 12 months     Current medicines are reviewed at length with the patient today.  The patient does not have concerns regarding medicines.  Signed, [name], MD  04/11/2022 9:30 AM    [address], [place], [place]  [address] Phone: [phone]; Fax: [phone]

## 2022-04-11 ENCOUNTER — Encounter: Payer: Self-pay | Admitting: Internal Medicine

## 2022-04-11 ENCOUNTER — Ambulatory Visit: Payer: BC Managed Care – PPO | Admitting: Internal Medicine

## 2022-04-11 VITALS — BP 120/68 | HR 63 | Ht 72.0 in | Wt 229.0 lb

## 2022-04-11 DIAGNOSIS — I2510 Atherosclerotic heart disease of native coronary artery without angina pectoris: Secondary | ICD-10-CM

## 2022-04-11 DIAGNOSIS — I10 Essential (primary) hypertension: Secondary | ICD-10-CM

## 2022-04-11 DIAGNOSIS — E038 Other specified hypothyroidism: Secondary | ICD-10-CM | POA: Diagnosis not present

## 2022-04-11 DIAGNOSIS — E785 Hyperlipidemia, unspecified: Secondary | ICD-10-CM

## 2022-04-11 DIAGNOSIS — E782 Mixed hyperlipidemia: Secondary | ICD-10-CM

## 2022-04-11 DIAGNOSIS — Z79899 Other long term (current) drug therapy: Secondary | ICD-10-CM

## 2022-04-11 NOTE — Patient Instructions (Signed)
Medication Instructions:   *If you need a refill on your cardiac medications before your next appointment, please call your pharmacy*   Lab Work: CBC, BMET, TSH, NMR, APO A, LIPO B, HGBA1C If you have labs (blood work) drawn today and your tests are completely normal, you will receive your results only by: [place] (if you have MyChart) OR A paper copy in the mail If you have any lab test that is abnormal or we need to change your treatment, we will call you to review the results.   Testing/Procedures:    Follow-Up: At [facility], you and your health needs are our priority.  As part of our continuing mission to provide you with exceptional heart care, we have created designated Provider Care Teams.  These Care Teams include your primary Cardiologist (physician) and Advanced Practice Providers (APPs -  Physician Assistants and Nurse Practitioners) who all work together to provide you with the care you need, when you need it.  We recommend signing up for the patient portal called "MyChart".  Sign up information is provided on this After Visit Summary.  MyChart is used to connect with patients for Virtual Visits (Telemedicine).  Patients are able to view lab/test results, encounter notes, upcoming appointments, etc.  Non-urgent messages can be sent to your provider as well.   To learn more about what you can do with MyChart, go to [email or web address].    Your next appointment:   1 year(s)  The format for your next appointment:   In Person  Provider:   [name], MD     Other Instructions   Important Information About Sugar

## 2022-04-13 LAB — NMR, LIPOPROFILE
Cholesterol, Total: 153 mg/dL (ref 100–199)
HDL Particle Number: 37.5 umol/L (ref >=30.5)
HDL-C: 49 mg/dL (ref >39)
LDL Particle Number: 1503 nmol/L — ABNORMAL HIGH (ref <1000)
LDL Size: 20 nm — ABNORMAL LOW (ref >20.5)
LDL-C (NIH Calc): 90 mg/dL (ref 0–99)
LP-IR Score: 80 — ABNORMAL HIGH (ref <=45)
Small LDL Particle Number: 1069 nmol/L — ABNORMAL HIGH (ref <=527)
Triglycerides: 73 mg/dL (ref 0–149)

## 2022-04-13 LAB — BASIC METABOLIC PANEL
BUN/Creatinine Ratio: 15 (ref 10–24)
BUN: 13 mg/dL (ref 8–27)
CO2: 27 mmol/L (ref 20–29)
Calcium: 9.4 mg/dL (ref 8.6–10.2)
Chloride: 105 mmol/L (ref 96–106)
Creatinine, Ser: 0.87 mg/dL (ref 0.76–1.27)
Glucose: 155 mg/dL — ABNORMAL HIGH (ref 70–99)
Potassium: 5.2 mmol/L (ref 3.5–5.2)
Sodium: 142 mmol/L (ref 134–144)
eGFR: 98 mL/min/{1.73_m2} (ref >59)

## 2022-04-13 LAB — APOLIPOPROTEIN B: Apolipoprotein B: 83 mg/dL (ref <90)

## 2022-04-13 LAB — LIPOPROTEIN A (LPA): Lipoprotein (a): <8.4 nmol/L (ref <75.0)

## 2022-04-13 LAB — CBC
Hematocrit: 40.4 % (ref 37.5–51.0)
Hemoglobin: 14 g/dL (ref 13.0–17.7)
MCH: 30.8 pg (ref 26.6–33.0)
MCHC: 34.7 g/dL (ref 31.5–35.7)
MCV: 89 fL (ref 79–97)
Platelets: 223 10*3/uL (ref 150–450)
RBC: 4.55 x10E6/uL (ref 4.14–5.80)
RDW: 13.1 % (ref 11.6–15.4)
WBC: 3.5 10*3/uL (ref 3.4–10.8)

## 2022-04-13 LAB — TSH: TSH: 0.027 u[IU]/mL — ABNORMAL LOW (ref 0.450–4.500)

## 2022-04-13 LAB — HEMOGLOBIN A1C
Est. average glucose Bld gHb Est-mCnc: 128 mg/dL
Hgb A1c MFr Bld: 6.1 % — ABNORMAL HIGH (ref 4.8–5.6)

## 2022-04-14 ENCOUNTER — Other Ambulatory Visit: Payer: Self-pay | Admitting: Internal Medicine

## 2022-04-16 ENCOUNTER — Telehealth: Payer: Self-pay | Admitting: *Deleted

## 2022-04-17 NOTE — Telephone Encounter (Signed)
I attempted to call the pt but no answer... I have sent him a My Chart message re: his results and will continue to try to follow up with him.

## 2022-04-20 ENCOUNTER — Other Ambulatory Visit: Payer: Self-pay | Admitting: Internal Medicine

## 2022-04-30 NOTE — Telephone Encounter (Signed)
-----   Message from Fay Records, MD sent at 04/14/2022 11:27 PM EDT ----- Electrolytes and kidney function are OK Thyroid   TSH is very low   Confirm what was done with synthroid,   IF cut back will need to continue to follow    Forward to [facility] A1C is mildly increased at 6.1   LImit carbs/sweets   Forward to [facility] LDL is 90 with lots of particles     WOuld add Zetia to regimen   Get lipomed in 8 wks with liver panel   Goal for LDL is below 70

## 2022-04-30 NOTE — Telephone Encounter (Signed)
I spoke with the pt and he declines starting Zetia for now.. he says he just retired and has not had his insurance set up fully yet... he says he would rather alter his diet for now but when he is ready he will call us and let us know when he can get started.Marland Kitchen  He says he has been in touch with Dr Buddy Duty that is managing his Thyroid... he will call him to be sure that he received the results that I forwarded to him.

## 2022-05-21 ENCOUNTER — Other Ambulatory Visit: Payer: Self-pay | Admitting: Internal Medicine

## 2022-11-21 ENCOUNTER — Other Ambulatory Visit: Payer: Self-pay | Admitting: Internal Medicine

## 2022-11-21 ENCOUNTER — Encounter: Payer: Self-pay | Admitting: Internal Medicine

## 2022-11-21 MED ORDER — ROSUVASTATIN CALCIUM 20 MG PO TABS: 20.0000 mg | ORAL_TABLET | Freq: Every day | ORAL | 1 refills | Status: DC

## 2023-03-07 DIAGNOSIS — Z85850 Personal history of malignant neoplasm of thyroid: Secondary | ICD-10-CM | POA: Diagnosis not present

## 2023-03-07 DIAGNOSIS — E890 Postprocedural hypothyroidism: Secondary | ICD-10-CM | POA: Diagnosis not present

## 2023-03-14 DIAGNOSIS — E890 Postprocedural hypothyroidism: Secondary | ICD-10-CM | POA: Diagnosis not present

## 2023-03-14 DIAGNOSIS — Z8546 Personal history of malignant neoplasm of prostate: Secondary | ICD-10-CM | POA: Diagnosis not present

## 2023-03-14 DIAGNOSIS — Z85850 Personal history of malignant neoplasm of thyroid: Secondary | ICD-10-CM | POA: Diagnosis not present

## 2023-05-21 ENCOUNTER — Other Ambulatory Visit: Payer: Self-pay | Admitting: Internal Medicine

## 2023-06-16 ENCOUNTER — Other Ambulatory Visit: Payer: Self-pay | Admitting: Internal Medicine

## 2023-06-17 ENCOUNTER — Other Ambulatory Visit: Payer: Self-pay | Admitting: Internal Medicine

## 2023-06-17 DIAGNOSIS — Z0000 Encounter for general adult medical examination without abnormal findings: Secondary | ICD-10-CM | POA: Diagnosis not present

## 2023-06-17 DIAGNOSIS — Z23 Encounter for immunization: Secondary | ICD-10-CM | POA: Diagnosis not present

## 2023-06-17 DIAGNOSIS — I700 Atherosclerosis of aorta: Secondary | ICD-10-CM | POA: Diagnosis not present

## 2023-06-17 DIAGNOSIS — Z131 Encounter for screening for diabetes mellitus: Secondary | ICD-10-CM | POA: Diagnosis not present

## 2023-06-17 DIAGNOSIS — E669 Obesity, unspecified: Secondary | ICD-10-CM | POA: Diagnosis not present

## 2023-06-17 DIAGNOSIS — Z1211 Encounter for screening for malignant neoplasm of colon: Secondary | ICD-10-CM | POA: Diagnosis not present

## 2023-06-17 DIAGNOSIS — E890 Postprocedural hypothyroidism: Secondary | ICD-10-CM | POA: Diagnosis not present

## 2023-06-17 DIAGNOSIS — Z125 Encounter for screening for malignant neoplasm of prostate: Secondary | ICD-10-CM | POA: Diagnosis not present

## 2023-06-17 DIAGNOSIS — Z6831 Body mass index (BMI) 31.0-31.9, adult: Secondary | ICD-10-CM | POA: Diagnosis not present

## 2023-06-17 DIAGNOSIS — Z130 Encounter for screening for diseases of the blood and blood-forming organs and certain disorders involving the immune mechanism: Secondary | ICD-10-CM | POA: Diagnosis not present

## 2023-06-17 DIAGNOSIS — I2510 Atherosclerotic heart disease of native coronary artery without angina pectoris: Secondary | ICD-10-CM | POA: Diagnosis not present

## 2023-06-17 DIAGNOSIS — E785 Hyperlipidemia, unspecified: Secondary | ICD-10-CM | POA: Diagnosis not present
